# Patient Record
Sex: Male | Born: 1975 | Race: White | Hispanic: No | Smoking: Never smoker
Health system: Southern US, Community
[De-identification: ages and names within clinical notes are randomized; demographics above are authoritative.]

## PROBLEM LIST (undated history)

## (undated) ENCOUNTER — Emergency Department (HOSPITAL_COMMUNITY): Admission: EM | Payer: Self-pay | Source: Home / Self Care

## (undated) DIAGNOSIS — I10 Essential (primary) hypertension: Secondary | ICD-10-CM

## (undated) DIAGNOSIS — F32A Depression, unspecified: Secondary | ICD-10-CM

## (undated) DIAGNOSIS — F329 Major depressive disorder, single episode, unspecified: Secondary | ICD-10-CM

---
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
## 2005-09-27 ENCOUNTER — Ambulatory Visit: Payer: Self-pay | Admitting: Urology

## 2005-10-04 ENCOUNTER — Ambulatory Visit: Payer: Self-pay | Admitting: Urology

## 2005-10-04 ENCOUNTER — Emergency Department: Payer: Self-pay | Admitting: Emergency Medicine

## 2005-10-24 ENCOUNTER — Ambulatory Visit: Payer: Self-pay | Admitting: Urology

## 2007-05-25 IMAGING — CR DG ABDOMEN 1V
1 series · 2 of 2 positions shown · non-contrast
Comparison: none

REASON FOR EXAM: renal calculi-lithotripsy
COMMENTS:

[Series 1: view not recorded · 0.17mm/px · 2 of 2 slices shown]
[im 1/2]
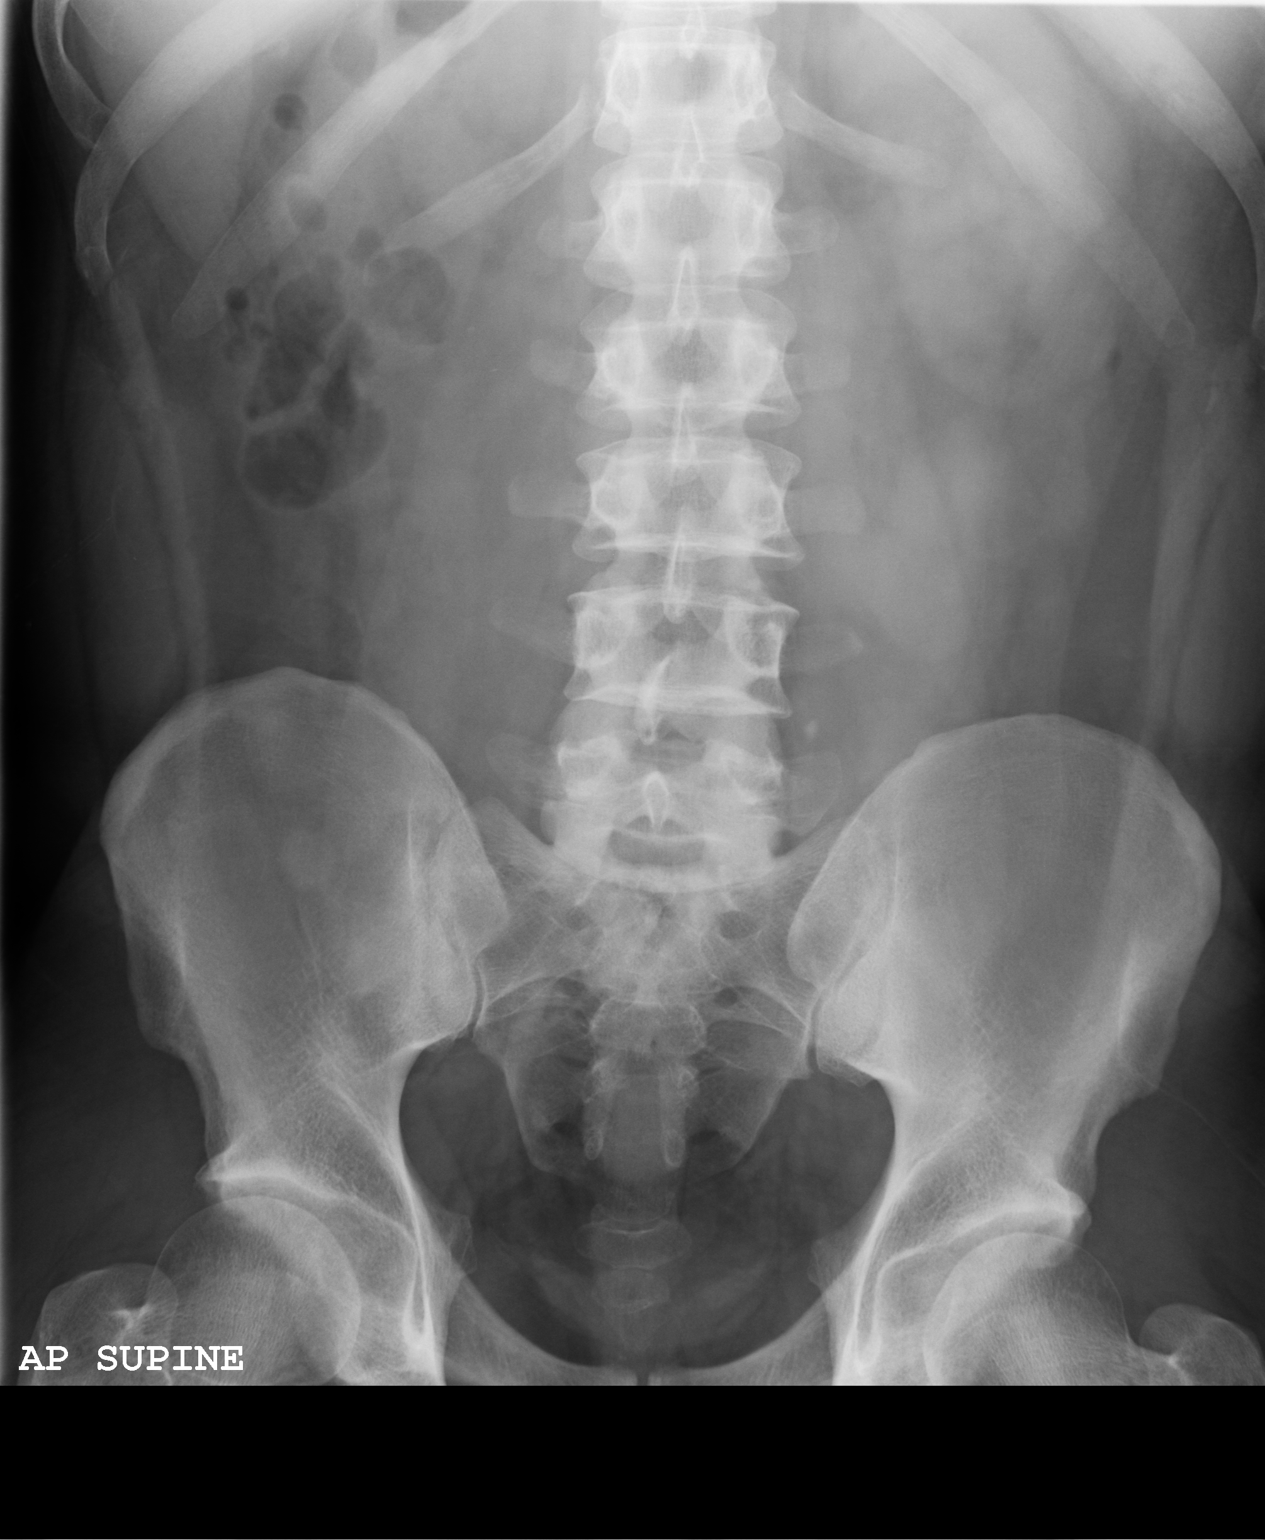
[im 2/2]
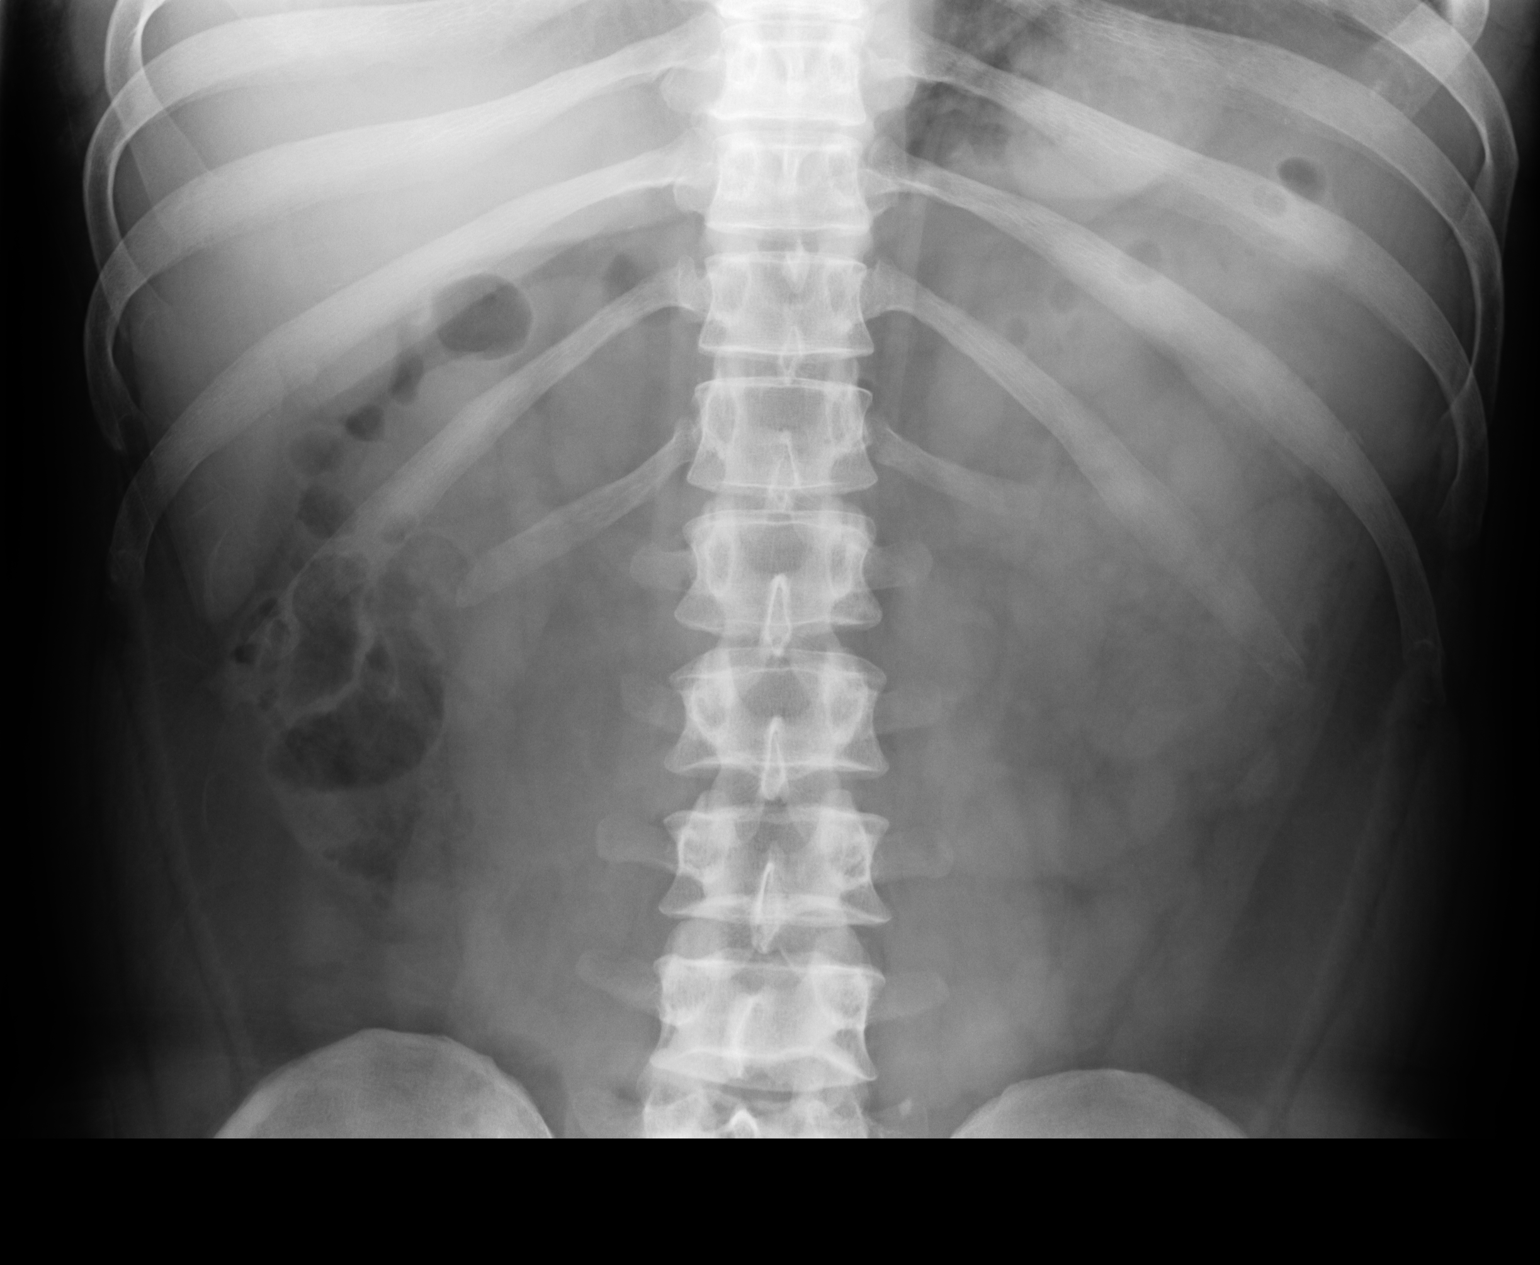

[2 of 2 positions shown; findings below may reference images not displayed]

PROCEDURE:     DXR - DXR KIDNEY URETER BLADDER  - October 04, 2005  [DATE]

RESULT:     AP view of the abdomen shows a 5 mm calcification inferior to
the L4 transverse process on the LEFT.  This calcification is consistent
with a LEFT ureteral stone that has moved inferiorly since the prior exam of
09-27-05 at which time the stone was located over the third LEFT transverse
process. No additional renal or ureteral calcifications are seen. The bowel
gas pattern is normal.  The osseous structures show no significant
abnormalities.
IMPRESSION: 1)Please see above.

## 2010-10-16 DIAGNOSIS — I10 Essential (primary) hypertension: Secondary | ICD-10-CM | POA: Insufficient documentation

## 2010-10-16 DIAGNOSIS — E669 Obesity, unspecified: Secondary | ICD-10-CM | POA: Insufficient documentation

## 2014-12-28 ENCOUNTER — Encounter: Payer: Self-pay | Admitting: Sports Medicine

## 2014-12-28 ENCOUNTER — Ambulatory Visit (INDEPENDENT_AMBULATORY_CARE_PROVIDER_SITE_OTHER): Payer: BLUE CROSS/BLUE SHIELD | Admitting: Sports Medicine

## 2014-12-28 ENCOUNTER — Ambulatory Visit (INDEPENDENT_AMBULATORY_CARE_PROVIDER_SITE_OTHER): Payer: BLUE CROSS/BLUE SHIELD

## 2014-12-28 DIAGNOSIS — M2142 Flat foot [pes planus] (acquired), left foot: Secondary | ICD-10-CM

## 2014-12-28 DIAGNOSIS — M79673 Pain in unspecified foot: Secondary | ICD-10-CM

## 2014-12-28 DIAGNOSIS — M7662 Achilles tendinitis, left leg: Secondary | ICD-10-CM

## 2014-12-28 DIAGNOSIS — M722 Plantar fascial fibromatosis: Secondary | ICD-10-CM | POA: Diagnosis not present

## 2014-12-28 DIAGNOSIS — M2141 Flat foot [pes planus] (acquired), right foot: Secondary | ICD-10-CM | POA: Diagnosis not present

## 2014-12-28 DIAGNOSIS — M205X9 Other deformities of toe(s) (acquired), unspecified foot: Secondary | ICD-10-CM

## 2014-12-28 DIAGNOSIS — M21549 Acquired clubfoot, unspecified foot: Secondary | ICD-10-CM | POA: Diagnosis not present

## 2014-12-28 DIAGNOSIS — M7661 Achilles tendinitis, right leg: Secondary | ICD-10-CM | POA: Diagnosis not present

## 2014-12-28 MED ORDER — TRIAMCINOLONE ACETONIDE 10 MG/ML IJ SUSP: 10.0000 mg | Freq: Once | INTRAMUSCULAR | Status: AC

## 2014-12-28 MED ORDER — MELOXICAM 15 MG PO TABS: 15.0000 mg | ORAL_TABLET | Freq: Every day | ORAL | Status: DC

## 2014-12-28 NOTE — Progress Notes (Deleted)
   Subjective:    Patient ID: Evan NearingJeremy W Antonacci, male    DOB: 1975-09-28, 39 y.o.   MRN: [id number]  HPI    Review of Systems  All other systems reviewed and are negative.      Objective:   Physical Exam        Assessment & Plan:

## 2014-12-28 NOTE — Progress Notes (Signed)
Patient ID: Evan Watson, male   DOB: 08/02/1975, 39 y.o.   MRN: [id number] Subjective: Evan Watson is a 39 y.o. male patient who presents to office for evaluation of bilateral foot pain. Patient complains of progressive pain especially over the last few months at the bottoms of both feet. Reports feet are most tender, especially at the end of the day. Report feels sharp stiffness at the big toe joint feels fatigued and tenderness at the nodules in the arches and also tightness along the heels/calves. Patient has tried Aleve with some relief and has tried changing his work shoes. Patient denies any other pedal complaints. Denies injury/trip/fall/sprain/any causative factors.   Review of Systems  All other systems reviewed and are negative.  There are no active problems to display for this patient.  No current outpatient prescriptions on file prior to visit.   No current facility-administered medications on file prior to visit.   Allergies  Allergen Reactions  . Bupropion Anxiety    Hypertension      Objective:  General: Alert and oriented x3 in no acute distress  Dermatology: No open lesions bilateral lower extremities, there are subcutaneous, well-defined, round fibrous nodules <0.5cm present at the medial plantar fascial bands bilateral, non-pulsatile,  no webspace macerations, no ecchymosis bilateral, all nails x 10 are well manicured.  Vascular: Dorsalis Pedis and Posterior Tibial pedal pulses 2/4, Capillary Fill Time 3 seconds,(+) pedal hair growth bilateral, no edema bilateral lower extremities, Temperature gradient within normal limits.  Neurology: Michaell Cowing sensation intact via light touch bilateral, Protective sensation intact  with Semmes Weinstein Monofilament to all pedal sites, Position sense intact, vibratory intact bilateral, Deep tendon reflexes within normal limits bilateral, No babinski sign present bilateral. - )Tinels sign bilateral.   Musculoskeletal: Most  tenderness with palpation at fibrous nodules bilateral, mild tenderness along medial fascial band and achilles tendon biltaeral, no nodularity, no dell, No pain with calf compression bilateral. There is decreased ankle rom with knee extending  vs flexed resembling gastroc equnius bilateral, Subtalar joint range of motion is within normal limits, there is no 1st ray hypermobility noted bilateral, decreased 1st MPJ rom Left>right with functional limitus noted on weightbearing exam.Strength within normal limits in all groups bilateral.   Gait: Mildy Antalgic gait with increased medial arch collapse and pronatory influence noted on Left>right foot with medial 1st MPJ roll off in toe-off.   Xrays  Right and Left Foot 3 views    Impression: Normal osseous mineralization, mild joint space narrowing with osteophyte formation at bilateral first metatarsophalangeal joints, mild enthesopathy noted peroneal tendon insertion, fifth metatarsal base and at the insertion of the Achilles and plantar fascia, bilateral heels, left greater than right, pes planus foot type, no fractures, no dislocations, soft tissues within normal limits. No foreign body.        Assessment and Plan: Problem List Items Addressed This Visit    None    Visit Diagnoses    Plantar fascial fibromatosis    -  Primary    Relevant Medications    triamcinolone acetonide (KENALOG) 10 MG/ML injection 10 mg    Foot pain, unspecified laterality        Relevant Medications    meloxicam (MOBIC) 15 MG tablet    triamcinolone acetonide (KENALOG) 10 MG/ML injection 10 mg    Other Relevant Orders    DG Foot Complete Left    DG Foot Complete Right    Plantar fasciitis, bilateral  Relevant Medications    meloxicam (MOBIC) 15 MG tablet    Achilles tendonitis, bilateral        Relevant Medications    meloxicam (MOBIC) 15 MG tablet    Acquired equinovarus deformity, unspecified laterality        Hallux limitus, unspecified laterality         Pes planus of both feet            -Complete examination performed -Xrays reviewed -Discussed treatement options for fibromas, fascitiis, tendinitis, hallux limitus and relations to foot type -At point of max tenderness at plantar fibromas to a 2 mL mixture of lidocaine, Marcaine, Kenalog and dexamethasone phosphate bilateral without complication -Recommend an dispensed daily stretching exercises -Recommend and dispensed plantar fascial brace to use daily -Recommend icing once to twice daily; dispensed ice pack -Recommended good supportive shoes daily and continuing with over-the-counter insert. If these do not improve his symptoms, we will consider custom functional foot orthotics -Arrival meloxicam 15 mg daily to supplement use of Aleve -Patient to return to office 3 weeks or sooner if condition worsens.  Asencion Islamitorya Ric Rosenberg, DPM

## 2014-12-28 NOTE — Patient Instructions (Signed)

## 2015-01-21 ENCOUNTER — Ambulatory Visit (INDEPENDENT_AMBULATORY_CARE_PROVIDER_SITE_OTHER): Payer: BLUE CROSS/BLUE SHIELD | Admitting: Sports Medicine

## 2015-01-21 ENCOUNTER — Encounter: Payer: Self-pay | Admitting: Sports Medicine

## 2015-01-21 DIAGNOSIS — M2141 Flat foot [pes planus] (acquired), right foot: Secondary | ICD-10-CM

## 2015-01-21 DIAGNOSIS — M2142 Flat foot [pes planus] (acquired), left foot: Secondary | ICD-10-CM

## 2015-01-21 DIAGNOSIS — M79673 Pain in unspecified foot: Secondary | ICD-10-CM | POA: Diagnosis not present

## 2015-01-21 DIAGNOSIS — M722 Plantar fascial fibromatosis: Secondary | ICD-10-CM

## 2015-01-21 DIAGNOSIS — M7662 Achilles tendinitis, left leg: Secondary | ICD-10-CM | POA: Diagnosis not present

## 2015-01-21 DIAGNOSIS — M7661 Achilles tendinitis, right leg: Secondary | ICD-10-CM

## 2015-01-21 DIAGNOSIS — M21549 Acquired clubfoot, unspecified foot: Secondary | ICD-10-CM

## 2015-01-21 DIAGNOSIS — M205X9 Other deformities of toe(s) (acquired), unspecified foot: Secondary | ICD-10-CM | POA: Diagnosis not present

## 2015-01-21 NOTE — Progress Notes (Signed)
Patient ID: Evan NearingJeremy W Hitch, male   DOB: 1975-03-21, 39 y.o.   MRN: [id number]  Subjective: Evan Watson is a 39 y.o. male patient who presents to office for evaluation of bilateral foot pain. Patient complains of progressive pain especially over the last few months at the bottoms of both feet. Reports feet are most tender, especially at the end of the day. Report feels sharp stiffness at the big toe joint feels fatigued and tenderness at the nodules in the arches and also tightness along the heels/calves. Patient has tried Aleve with some relief and has tried changing his work shoes. Patient denies any other pedal complaints. Denies injury/trip/fall/sprain/any causative factors.   Review of Systems  All other systems reviewed and are negative.  There are no active problems to display for this patient.  Current Outpatient Prescriptions on File Prior to Visit  Medication Sig Dispense Refill  . citalopram (CELEXA) 20 MG tablet   1  . meloxicam (MOBIC) 15 MG tablet Take 1 tablet (15 mg total) by mouth daily. 30 tablet 0   Current Facility-Administered Medications on File Prior to Visit  Medication Dose Route Frequency Provider Last Rate Last Dose  . triamcinolone acetonide (KENALOG) 10 MG/ML injection 10 mg  10 mg Other Once Asencion Islamitorya Manolo Bosket, DPM       Allergies  Allergen Reactions  . Bupropion Anxiety    Hypertension      Objective:  General: Alert and oriented x3 in no acute distress  Dermatology: No open lesions bilateral lower extremities, there are subcutaneous, well-defined, round fibrous nodules that appear smaller than last encounter present at the medial plantar fascial bands bilateral, non-pulsatile,  no webspace macerations, no ecchymosis bilateral, all nails x 10 are well manicured.  Vascular: Dorsalis Pedis and Posterior Tibial pedal pulses 2/4, Capillary Fill Time 3 seconds,(+) pedal hair growth bilateral, no edema bilateral lower extremities, Temperature gradient within  normal limits.  Neurology: Michaell CowingGross sensation intact via light touch bilateral, Protective sensation intact  with Semmes Weinstein Monofilament to all pedal sites, Position sense intact, vibratory intact bilateral, Deep tendon reflexes within normal limits bilateral, No babinski sign present bilateral. (-) Tinels sign bilateral.   Musculoskeletal: No tenderness with palpation at fibrous nodules bilateral, Decreased tenderness along medial fascial band and achilles tendon biltaeral, no nodularity, no dell, No pain with calf compression bilateral. There is decreased ankle rom with knee extending  vs flexed resembling gastroc equnius bilateral, Subtalar joint range of motion is within normal limits, there is no 1st ray hypermobility noted bilateral, decreased 1st MPJ rom Left>right with functional limitus noted on weightbearing exam.Strength within normal limits in all groups bilateral.    Assessment and Plan: Problem List Items Addressed This Visit    None    Visit Diagnoses    Plantar fascial fibromatosis    -  Primary    Plantar fasciitis, bilateral        Achilles tendonitis, bilateral        Acquired equinovarus deformity, unspecified laterality        Hallux limitus, unspecified laterality        Pes planus of both feet        Foot pain, unspecified laterality           -Complete examination performed -Discussed treatement options for fibromas, fascitiis, tendinitis, hallux limitus and relations to foot type -Recommend cont with daily stretching exercises -Recommend cont with plantar fascial brace to use daily and alternation of night splint of which he owns -Recommend  cont icing once to twice daily -Scanned patient today for custom functional foot orthotics with deep heel accomodation; Rx sent to [facility] lab  -Patient to return to office pick up orthotics or sooner if condition worsens.  Asencion Islam, DPM

## 2015-02-10 ENCOUNTER — Telehealth: Payer: Self-pay | Admitting: *Deleted

## 2015-02-10 DIAGNOSIS — M7662 Achilles tendinitis, left leg: Secondary | ICD-10-CM

## 2015-02-10 DIAGNOSIS — M722 Plantar fascial fibromatosis: Secondary | ICD-10-CM

## 2015-02-10 DIAGNOSIS — M7661 Achilles tendinitis, right leg: Secondary | ICD-10-CM

## 2015-02-10 DIAGNOSIS — M79673 Pain in unspecified foot: Secondary | ICD-10-CM

## 2015-02-10 MED ORDER — MELOXICAM 15 MG PO TABS: 15.0000 mg | ORAL_TABLET | Freq: Every day | ORAL | Status: DC

## 2015-02-10 NOTE — Telephone Encounter (Addendum)
Pt request refill of Mobic and status of his orthotics.  Left message informing that the Meloxicam had been called to the previously ordered pharmacy and that Dr. Wynema Birch assistant would be calling with the status of the orthotic, but it often took right at 4-6 weeks before the orthotics came to the office.

## 2015-02-23 ENCOUNTER — Ambulatory Visit (INDEPENDENT_AMBULATORY_CARE_PROVIDER_SITE_OTHER): Payer: BLUE CROSS/BLUE SHIELD | Admitting: Podiatry

## 2015-02-23 DIAGNOSIS — M722 Plantar fascial fibromatosis: Secondary | ICD-10-CM

## 2015-02-23 DIAGNOSIS — M7662 Achilles tendinitis, left leg: Secondary | ICD-10-CM

## 2015-02-23 DIAGNOSIS — M7661 Achilles tendinitis, right leg: Secondary | ICD-10-CM

## 2015-04-05 ENCOUNTER — Ambulatory Visit: Payer: BLUE CROSS/BLUE SHIELD | Admitting: Sports Medicine

## 2018-09-25 ENCOUNTER — Encounter: Payer: Self-pay | Admitting: Emergency Medicine

## 2018-09-25 ENCOUNTER — Emergency Department
Admission: EM | Admit: 2018-09-25 | Discharge: 2018-09-25 | Disposition: A | Discharge status: Home / Self Care | Payer: Managed Care, Other (non HMO) | Attending: Student | Admitting: Student

## 2018-09-25 DIAGNOSIS — R55 Syncope and collapse: Secondary | ICD-10-CM | POA: Insufficient documentation

## 2018-09-25 DIAGNOSIS — E860 Dehydration: Secondary | ICD-10-CM | POA: Insufficient documentation

## 2018-09-25 DIAGNOSIS — R4020 Unspecified coma: Secondary | ICD-10-CM

## 2018-09-25 DIAGNOSIS — I10 Essential (primary) hypertension: Secondary | ICD-10-CM | POA: Insufficient documentation

## 2018-09-25 HISTORY — DX: Depression, unspecified: F32.A

## 2018-09-25 HISTORY — DX: Essential (primary) hypertension: I10

## 2018-09-25 LAB — TROPONIN I (HIGH SENSITIVITY): Troponin I (High Sensitivity): 3 ng/L (ref <18)

## 2018-09-25 LAB — BASIC METABOLIC PANEL
Anion gap: 8 (ref 5–15)
BUN: 22 mg/dL — ABNORMAL HIGH (ref 6–20)
CO2: 24 mmol/L (ref 22–32)
Calcium: 9.1 mg/dL (ref 8.9–10.3)
Chloride: 105 mmol/L (ref 98–111)
Creatinine, Ser: 1.29 mg/dL — ABNORMAL HIGH (ref 0.61–1.24)
GFR calc Af Amer: >60 mL/min (ref >60)
GFR calc non Af Amer: >60 mL/min (ref >60)
Glucose, Bld: 152 mg/dL — ABNORMAL HIGH (ref 70–99)
Potassium: 3.7 mmol/L (ref 3.5–5.1)
Sodium: 137 mmol/L (ref 135–145)

## 2018-09-25 LAB — URINALYSIS, COMPLETE (UACMP) WITH MICROSCOPIC
Bacteria, UA: NONE SEEN
Bilirubin Urine: NEGATIVE
Glucose, UA: NEGATIVE mg/dL
Hgb urine dipstick: NEGATIVE
Ketones, ur: NEGATIVE mg/dL
Leukocytes,Ua: NEGATIVE
Nitrite: NEGATIVE
Protein, ur: 30 mg/dL — AB
Specific Gravity, Urine: 1.017 (ref 1.005–1.030)
Squamous Epithelial / HPF: NONE SEEN (ref 0–5)
pH: 7 (ref 5.0–8.0)

## 2018-09-25 LAB — CBC
HCT: 46.3 % (ref 39.0–52.0)
Hemoglobin: 16.4 g/dL (ref 13.0–17.0)
MCH: 30.3 pg (ref 26.0–34.0)
MCHC: 35.4 g/dL (ref 30.0–36.0)
MCV: 85.4 fL (ref 80.0–100.0)
Platelets: 238 10*3/uL (ref 150–400)
RBC: 5.42 MIL/uL (ref 4.22–5.81)
RDW: 12.9 % (ref 11.5–15.5)
WBC: 9.8 10*3/uL (ref 4.0–10.5)
nRBC: 0 % (ref 0.0–0.2)

## 2018-09-25 MED ORDER — SODIUM CHLORIDE 0.9 % IV BOLUS
1000.0000 mL | Freq: Once | INTRAVENOUS | Status: AC
  Administered 2018-09-25: 1000 mL via INTRAVENOUS

## 2018-09-25 NOTE — ED Triage Notes (Signed)
Pt in via [facility] from place of employment, reports syncope episode x 2 today; first occurrence post donating plasma, provided liter of IV fluids, gatorade and a snack prior to departure.  Pt reports arriving to work shortly after, was attempting to eat some lunch, began feeling weak again and upon standing, second syncopal episode occurred.  Pt denies falling, denies hitting head, had another employee with him at time of episode.  Pt A/Ox4 upon arrival, received 262mL NS prior to arrival via EMS.  Pt appears pale, BP soft, other vitals WDL.  NAD noted at this time.

## 2018-09-25 NOTE — Discharge Instructions (Addendum)
Thank you for letting us take care of you in the emergency department today.   Please continue to take your regular, prescribed medications. Please stay well hydrated and be sure you drink plenty of water and eat before your next donation. Thank you for donating!  Please follow up with: - Your primary care doctor to review your ER visit and follow up on your symptoms.   Please return to the ER for any new or worsening symptoms.

## 2018-09-25 NOTE — ED Notes (Signed)
EDP to bedside. 

## 2018-09-25 NOTE — ED Provider Notes (Signed)
[facility] Emergency Department Provider Note  ____________________________________________   First MD Initiated Contact with Patient 09/25/18 1523     (approximate)  I have reviewed the triage vital signs and the nursing notes.  History  Chief Complaint Loss of Consciousness    HPI Evan Watson is a 43 y.o. male with a history of hypertension, depression who presents the emergency department for loss of consciousness.  Patient was donating plasma today, and had 1 brief episode immediately after his donation. He was give IVF, took PO fluids, and then felt like he was improved so he headed in to work.  At work he began to feel weak and lightheaded again, and had a second episode.  No associated falls or trauma, no head injury.  No shaking or seizure-like activity described.  He was immediately alert and oriented after the events.  Patient states before donating blood, he did not eat or drink a significant amount. No hx of arrhythmias.          Past Medical Hx Past Medical History:  Diagnosis Date  . Depression   . Hypertension     Problem List There are no active problems to display for this patient.   Past Surgical Hx History reviewed. No pertinent surgical history.  Medications Prior to Admission medications   Medication Sig Start Date End Date Taking? Authorizing Provider  citalopram (CELEXA) 20 MG tablet  10/28/14   [provider]  meloxicam (MOBIC) 15 MG tablet Take 1 tablet (15 mg total) by mouth daily. 02/10/15   Asencion IslamStover, Titorya, DPM    Allergies Bupropion  Family Hx No family history on file.  Social Hx Social History   Tobacco Use  . Smoking status: Never Smoker  . Smokeless tobacco: Never Used  Substance Use Topics  . Alcohol use: Yes  . Drug use: Never     Review of Systems  Constitutional: Negative for fever. Negative for chills. Eyes: Negative for visual changes. ENT: Negative for sore throat.  Cardiovascular: Negative for chest pain. Respiratory: Negative for shortness of breath. Gastrointestinal: Negative for abdominal pain. Negative for nausea. Negative for vomiting. Genitourinary: Negative for dysuria. Musculoskeletal: Negative for leg swelling. Skin: Negative for rash. Neurological: + loss of consciousness   Physical Exam  Vital Signs: ED Triage Vitals  Enc Vitals Group     BP 09/25/18 1517 100/66     Pulse Rate 09/25/18 1517 67     Resp 09/25/18 1517 19     Temp 09/25/18 1517 97.9 F (36.6 C)     Temp Source 09/25/18 1517 Oral     SpO2 09/25/18 1517 95 %     Weight 09/25/18 1518 252 lb (114.3 kg)     Height 09/25/18 1518 5\' 7"  (1.702 m)     Head Circumference --      Peak Flow --      Pain Score 09/25/18 1518 0     Pain Loc --      Pain Edu? --      Excl. in GC? --     Constitutional: Alert and oriented.  Eyes: Conjunctivae clear. Sclera anicteric. Head: Normocephalic. Atraumatic. Nose: No congestion. No rhinorrhea. Mouth/Throat: Mucous membranes are moist.  Neck: No stridor.   Cardiovascular: Normal rate, regular rhythm. No murmurs. Extremities well perfused. Respiratory: Normal respiratory effort.  Lungs CTAB. Gastrointestinal: Soft and non-tender. No distention.  Musculoskeletal: No lower extremity edema. Neurologic:  Normal speech and language. No gross focal neurologic deficits are appreciated.  Skin: Skin is warm, dry and intact. No rash noted. Psychiatric: Mood and affect are appropriate for situation.  EKG  Personally reviewed.   Rate: 80 Rhythm: sinus Axis: normal Intervals: WNL No acute ischemic changes No evidence of Brugada, Wolff-Parkinson-White, or prolonged QTC No STEMI    Radiology  N/A   Procedures  Procedure(s) performed (including critical care):  Procedures   Initial Impression / Assessment and Plan / ED Course  43 y.o. male who presents to the ED for loss of consciousness after donating blood today.  Ddx:  vasovagal, orthostatic, dehydration, likely combination of multiple.  Presentation not consistent with seizure activity.  Not consistent with acute intracranial abnormality, no associated head trauma, do not feel emergent head imaging is indicated.  Plan: labs, fluids, EKG, reassess  EKG without ischemic changes or evidence of arrhythmia.  High-sensitivity troponin negative.  No evidence of urine infection.  BMP consistent with mild dehydration, creatinine 1.29.  Patient feeling improved after fluids, tolerated PO.  Improvement in blood pressure 116/55.  As such, will plan for discharge.  Patient voices understanding and is comfortable with the plan.  Advised PCP follow-up, given return precautions.   Final Clinical Impression(s) / ED Diagnosis  Final diagnoses:  Loss of consciousness ([place])  Dehydration       Note:  This document was prepared using Dragon voice recognition software and may include unintentional dictation errors.   Lilia Pro., MD 09/25/18 [phone]

## 2018-09-25 NOTE — ED Notes (Signed)
Patient was able to consume crackers and ginger ale without nausea.

## 2018-12-17 ENCOUNTER — Other Ambulatory Visit: Payer: Self-pay

## 2018-12-17 ENCOUNTER — Telehealth: Payer: Self-pay | Admitting: *Deleted

## 2018-12-17 DIAGNOSIS — Z20822 Contact with and (suspected) exposure to covid-19: Secondary | ICD-10-CM

## 2018-12-17 NOTE — Telephone Encounter (Signed)
This encounter was created in error - please disregard.

## 2018-12-17 NOTE — Telephone Encounter (Signed)
Patient is calling to request verification of COVID testing for his PCP. Patient needs note for work regarding his testing for COVID. His PCP will write the note if he gets a copy of the order faxed to him.  PCP: Valera Castle, MD Fax: [phone]  Told patient I would request copy of order faxed to PCP.

## 2018-12-17 NOTE — Telephone Encounter (Signed)
Call to PCP office- they can see chart in [place]- but can not access pending test. Verified patient tested for COVID : 11/25 9:45 [place], [place]. Patient is awaiting results. Have also requested copy of order for PCP.

## 2018-12-17 NOTE — Telephone Encounter (Signed)
Per pt. Request, faxed copy of [facility] lab order to PCP office, Dr. Kym Groom @ [phone].  Call placed to pt. Advised that the information was faxed to his PCP office as requested.  Verb. Understanding.

## 2018-12-19 LAB — NOVEL CORONAVIRUS, NAA: SARS-CoV-2, NAA: NOT DETECTED

## 2018-12-22 ENCOUNTER — Telehealth: Payer: Self-pay | Admitting: Family Medicine

## 2018-12-22 NOTE — Telephone Encounter (Signed)
Patient is calling receive his negative COVID test results. Patient expressed understanding. 

## 2019-07-22 ENCOUNTER — Other Ambulatory Visit: Payer: Self-pay

## 2019-07-22 ENCOUNTER — Encounter: Payer: Self-pay | Admitting: Urology

## 2019-07-22 ENCOUNTER — Ambulatory Visit: Payer: Managed Care, Other (non HMO) | Admitting: Urology

## 2019-07-22 VITALS — BP 131/84 | HR 68 | Ht 67.0 in | Wt 245.1 lb

## 2019-07-22 DIAGNOSIS — R3129 Other microscopic hematuria: Secondary | ICD-10-CM

## 2019-07-22 NOTE — Progress Notes (Signed)
   06/23/19 10:00 AM   Evan Watson 09-03-1975 [id number]  Referring provider: Dione Housekeeper, MD [address] [place],  [place] [address] Chief Complaint  Patient presents with  . Hematuria    HPI: Evan Watson is a 44 y.o. male who presents for gross hematuria.  -Seen by his PCP on 07/08/2019 complaining of LUTS including frequency, urgency, mild dysuria, weak stream, and pelvic discomfort -Urinalysis showed no significant micro hematuria -Urine culture grew mixed flora -Denies gross hematuria -prior history of stone disease treated with lithotripsy in 2007 but he did not follow up after treatment -No previous tobacco use -Was given Rx tamsulosin by his PCP but was told to hold off taking for 2 weeks and he is going to start today  PMH: Past Medical History:  Diagnosis Date  . Depression   . Hypertension     Surgical History: No past surgical history on file.  Home Medications:  Allergies as of 07/22/2019   No Active Allergies     Medication List       Accurate as of July 22, 2019 10:00 AM. If you have any questions, ask your nurse or doctor.        albuterol 108 (90 Base) MCG/ACT inhaler Commonly known as: VENTOLIN HFA Inhale 1-2 puffs into the lungs every 6 (six) hours as needed for wheezing or shortness of breath.   amLODipine 10 MG tablet Commonly known as: NORVASC Take 10 mg by mouth daily.   buPROPion 150 MG 24 hr tablet Commonly known as: WELLBUTRIN XL Take 150 mg by mouth daily.   citalopram 40 MG tablet Commonly known as: CELEXA Take 40 mg by mouth daily.   hydrochlorothiazide 25 MG tablet Commonly known as: HYDRODIURIL Take 25 mg by mouth daily.   losartan 100 MG tablet Commonly known as: COZAAR Take 100 mg by mouth daily.       Allergies: No Active Allergies  Family History: No family history on file.  Social History:  reports that he has never smoked. He has never used smokeless tobacco. He reports current alcohol  use. He reports that he does not use drugs.   Physical Exam: There were no vitals taken for this visit.  Constitutional:  Alert and oriented, No acute distress. HEENT: [place] AT, moist mucus membranes.  Trachea midline, no masses. Cardiovascular: No clubbing, cyanosis, or edema. Respiratory: Normal respiratory effort, no increased work of breathing. Skin: No rashes, bruises or suspicious lesions. Neurologic: Grossly intact, no focal deficits, moving all 4 extremities. Psychiatric: Normal mood and affect.  Laboratory Data:  Lab Results  Component Value Date   CREATININE 1.29 (H) 09/25/2018    Urinalysis Dipstick 1+ blood/microscopy 3-10 RBC   Assessment & Plan:    1. Microscopic Hematuria -UA risk stratification: Intermediate -We discussed the standard recommended evaluation of intermediate risk hematuria including renal ultrasound and cystoscopy. -He agreed to the renal ultrasound but wants to think about cystoscopy and he would like to start his tamsulosin before scheduling   2. Lower urinary tract symptoms -To start tamsulosin today, most likely prostatic in origin  [facility] [address] [place], [place] [address] [phone]  I, Francina Ames Peace, am acting as a Neurosurgeon for Dr. Lorin Picket C. Olla Delancey.  I have reviewed the above documentation for accuracy and completeness, and I agree with the above.    Riki Altes, MD

## 2019-07-23 LAB — URINALYSIS, COMPLETE
Bilirubin, UA: NEGATIVE
Glucose, UA: NEGATIVE
Ketones, UA: NEGATIVE
Leukocytes,UA: NEGATIVE
Nitrite, UA: NEGATIVE
Protein,UA: NEGATIVE
Specific Gravity, UA: 1.015 (ref 1.005–1.030)
Urobilinogen, Ur: 0.2 mg/dL (ref 0.2–1.0)
pH, UA: 7.5 (ref 5.0–7.5)

## 2019-07-23 LAB — MICROSCOPIC EXAMINATION: Bacteria, UA: NONE SEEN

## 2019-07-24 ENCOUNTER — Encounter: Payer: Self-pay | Admitting: Urology

## 2021-01-31 ENCOUNTER — Other Ambulatory Visit: Payer: Self-pay

## 2021-01-31 ENCOUNTER — Emergency Department
Admission: EM | Admit: 2021-01-31 | Discharge: 2021-01-31 | Disposition: A | Discharge status: Home / Self Care | Payer: 59 | Attending: Emergency Medicine | Admitting: Emergency Medicine

## 2021-01-31 DIAGNOSIS — U071 COVID-19: Secondary | ICD-10-CM

## 2021-01-31 DIAGNOSIS — R059 Cough, unspecified: Secondary | ICD-10-CM | POA: Diagnosis present

## 2021-01-31 DIAGNOSIS — I10 Essential (primary) hypertension: Secondary | ICD-10-CM | POA: Insufficient documentation

## 2021-01-31 DIAGNOSIS — E860 Dehydration: Secondary | ICD-10-CM | POA: Insufficient documentation

## 2021-01-31 DIAGNOSIS — R197 Diarrhea, unspecified: Secondary | ICD-10-CM

## 2021-01-31 LAB — LIPASE, BLOOD: Lipase: 26 U/L (ref 11–51)

## 2021-01-31 LAB — URINALYSIS, ROUTINE W REFLEX MICROSCOPIC
Bilirubin Urine: NEGATIVE
Glucose, UA: NEGATIVE mg/dL
Ketones, ur: NEGATIVE mg/dL
Leukocytes,Ua: NEGATIVE
Nitrite: NEGATIVE
Protein, ur: 30 mg/dL — AB
Specific Gravity, Urine: 1.027 (ref 1.005–1.030)
pH: 5 (ref 5.0–8.0)

## 2021-01-31 LAB — GASTROINTESTINAL PANEL BY PCR, STOOL (REPLACES STOOL CULTURE)

## 2021-01-31 LAB — COMPREHENSIVE METABOLIC PANEL
ALT: 28 U/L (ref 0–44)
AST: 19 U/L (ref 15–41)
Albumin: 4.3 g/dL (ref 3.5–5.0)
Alkaline Phosphatase: 78 U/L (ref 38–126)
Anion gap: 9 (ref 5–15)
BUN: 21 mg/dL — ABNORMAL HIGH (ref 6–20)
CO2: 26 mmol/L (ref 22–32)
Calcium: 10.1 mg/dL (ref 8.9–10.3)
Chloride: 103 mmol/L (ref 98–111)
Creatinine, Ser: 1.09 mg/dL (ref 0.61–1.24)
GFR, Estimated: >60 mL/min (ref >60)
Glucose, Bld: 101 mg/dL — ABNORMAL HIGH (ref 70–99)
Potassium: 3.4 mmol/L — ABNORMAL LOW (ref 3.5–5.1)
Sodium: 138 mmol/L (ref 135–145)
Total Bilirubin: 1 mg/dL (ref 0.3–1.2)
Total Protein: 7.8 g/dL (ref 6.5–8.1)

## 2021-01-31 LAB — CBC
HCT: 47.2 % (ref 39.0–52.0)
Hemoglobin: 16.6 g/dL (ref 13.0–17.0)
MCH: 29.6 pg (ref 26.0–34.0)
MCHC: 35.2 g/dL (ref 30.0–36.0)
MCV: 84.1 fL (ref 80.0–100.0)
Platelets: 310 10*3/uL (ref 150–400)
RBC: 5.61 MIL/uL (ref 4.22–5.81)
RDW: 12.9 % (ref 11.5–15.5)
WBC: 13.8 10*3/uL — ABNORMAL HIGH (ref 4.0–10.5)
nRBC: 0 % (ref 0.0–0.2)

## 2021-01-31 LAB — MAGNESIUM: Magnesium: 1.8 mg/dL (ref 1.7–2.4)

## 2021-01-31 LAB — C DIFFICILE QUICK SCREEN W PCR REFLEX
C Diff antigen: NEGATIVE
C Diff interpretation: NOT DETECTED
C Diff toxin: NEGATIVE

## 2021-01-31 MED ORDER — ONDANSETRON 4 MG PO TBDP: 4.0000 mg | ORAL_TABLET | Freq: Three times a day (TID) | ORAL | 0 refills | Status: DC | PRN

## 2021-01-31 MED ORDER — LACTATED RINGERS IV BOLUS
1000.0000 mL | Freq: Once | INTRAVENOUS | Status: AC
  Administered 2021-01-31: 1000 mL via INTRAVENOUS

## 2021-01-31 MED ORDER — LOPERAMIDE HCL 2 MG PO CAPS
2.0000 mg | ORAL_CAPSULE | Freq: Once | ORAL | Status: AC
Start: 2021-01-31 — End: 2021-01-31
  Administered 2021-01-31: 2 mg via ORAL
  Filled 2021-01-31: qty 1

## 2021-01-31 MED ORDER — ONDANSETRON HCL 4 MG/2ML IJ SOLN
4.0000 mg | Freq: Once | INTRAMUSCULAR | Status: AC
  Administered 2021-01-31: 4 mg via INTRAVENOUS
  Filled 2021-01-31: qty 2

## 2021-01-31 NOTE — ED Triage Notes (Signed)
Pt states he was dx with covid jan 1st, states he thought he was getting better and then started having diarrhea q12min to hour since Sunday it has progressed and is having dizziness in the past 24hrs,

## 2021-01-31 NOTE — ED Provider Notes (Signed)
[facility] Provider Note    Event Date/Time   First MD Initiated Contact with Patient 01/31/21 1054     (approximate)   History   Chief Complaint Covid Positive, Diarrhea, and Dizziness   HPI  Evan Watson is a 46 y.o. male with past medical history of hypertension and depression who presents to the ED complaining of diarrhea.  Patient reports that he initially tested positive for COVID-19 on January 1, initially felt well with only a mild cough.  Over the past 3 days, he has developed severe diarrhea.  He was seen by his PCP for this problem yesterday and told to take Imodium.  Diarrhea has only worsened since then and he reports having a watery bowel movement about every hour.  He has not noticed any blood in his stool and denies any abdominal pain, does report nausea but denies any vomiting.  He has not had any fevers or recent travel.  He last took a dose of Imodium around 3:00 this morning without significant relief.  He is not aware of any recent sick contacts.     Physical Exam   Triage Vital Signs: ED Triage Vitals [01/31/21 1027]  Enc Vitals Group     BP (!) 144/90     Pulse Rate 83     Resp 19     Temp 98.1 F (36.7 C)     Temp Source Oral     SpO2 97 %     Weight 250 lb (113.4 kg)     Height 5\' 7"  (1.702 m)     Head Circumference      Peak Flow      Pain Score      Pain Loc      Pain Edu?      Excl. in [place]?     Most recent vital signs: Vitals:   01/31/21 1027  BP: (!) 144/90  Pulse: 83  Resp: 19  Temp: 98.1 F (36.7 C)  SpO2: 97%    Constitutional: Alert and oriented. Eyes: Conjunctivae are normal. Head: Atraumatic. Nose: No congestion/rhinnorhea. Mouth/Throat: Mucous membranes are dry.  Cardiovascular: Normal rate, regular rhythm. Grossly normal heart sounds.  2+ radial pulses bilaterally. Respiratory: Normal respiratory effort.  No retractions. Lungs CTAB. Gastrointestinal: Soft and nontender. No  distention. Genitourinary: Deferred. Musculoskeletal: No lower extremity tenderness nor edema.  Neurologic:  Normal speech and language. No gross focal neurologic deficits are appreciated.    ED Results / Procedures / Treatments   Labs (all labs ordered are listed, but only abnormal results are displayed) Labs Reviewed  COMPREHENSIVE METABOLIC PANEL - Abnormal; Notable for the following components:      Result Value   Potassium 3.4 (*)    Glucose, Bld 101 (*)    BUN 21 (*)    All other components within normal limits  CBC - Abnormal; Notable for the following components:   WBC 13.8 (*)    All other components within normal limits  URINALYSIS, ROUTINE W REFLEX MICROSCOPIC - Abnormal; Notable for the following components:   Color, Urine YELLOW (*)    APPearance CLOUDY (*)    Hgb urine dipstick MODERATE (*)    Protein, ur 30 (*)    Bacteria, UA FEW (*)    All other components within normal limits  C DIFFICILE QUICK SCREEN W PCR REFLEX    GASTROINTESTINAL PANEL BY PCR, STOOL (REPLACES STOOL CULTURE)  LIPASE, BLOOD  MAGNESIUM     EKG  ED  ECG REPORT I, Blake Divine, the attending physician, personally viewed and interpreted this ECG.   Date: 01/31/2021  EKG Time: 10:31  Rate: 83  Rhythm: normal sinus rhythm  Axis: Normal  Intervals:none  ST&T Change: None   PROCEDURES:  Critical Care performed: No  Procedures   MEDICATIONS ORDERED IN ED: Medications  lactated ringers bolus 1,000 mL (0 mLs Intravenous Stopped 01/31/21 1309)  ondansetron (ZOFRAN) injection 4 mg (4 mg Intravenous Given 01/31/21 1140)  loperamide (IMODIUM) capsule 2 mg (2 mg Oral Given 01/31/21 1140)     IMPRESSION / MDM / ASSESSMENT AND PLAN / ED COURSE  I reviewed the triage vital signs and the nursing notes.                              46 y.o. male with past medical history of hypertension and depression who presents to the ED complaining of profuse watery diarrhea over the past 3 days  with some nausea but no abdominal pain or vomiting.  Differential diagnosis includes, but is not limited to, viral gastroenteritis, C. difficile colitis, inflammatory colitis, dehydration, electrolyte abnormality.  Patient is well-appearing with reassuring vital signs, no abdominal tenderness noted on exam.  Labs are pending we will assess for AKI or electrolyte abnormality as patient does appear slightly dehydrated.  We will attempt obtain stool sample to rule out C. difficile colitis, although low suspicion for this as patient has not been on any antibiotics recently.  We will treat symptomatically with IV fluids and IV Zofran, reassess following results.  Chart reviewed from patient's primary care visit yesterday, reassuring work-up at this time and he is status post Paxlovid treatment.  Stool studies are negative for C. difficile, additional labs are unremarkable with no electrolyte abnormality, LFTs within normal limits.  CBC shows mild leukocytosis which is nonspecific, no anemia noted.  Patient is feeling better following IV fluids and Zofran, diarrhea decreased following dose of loperamide.  He is appropriate for outpatient management and will be prescribed Zofran, counseled to take loperamide as needed at home.  He was counseled to follow-up with PCP and to return to the ED for new worsening symptoms, patient agrees with plan.       FINAL CLINICAL IMPRESSION(S) / ED DIAGNOSES   Final diagnoses:  COVID-19  Diarrhea, unspecified type  Dehydration     Rx / DC Orders   ED Discharge Orders          Ordered    ondansetron (ZOFRAN-ODT) 4 MG disintegrating tablet  Every 8 hours PRN        01/31/21 1448             Note:  This document was prepared using Dragon voice recognition software and may include unintentional dictation errors.   Blake Divine, MD 01/31/21 1451

## 2021-01-31 NOTE — ED Notes (Signed)
THIS TECH OBTAINED A FULL RAINBOW LAB SET. PT STATED TO BE ON "BLOOD THINNERS."

## 2021-08-30 DIAGNOSIS — K029 Dental caries, unspecified: Secondary | ICD-10-CM | POA: Insufficient documentation

## 2022-06-27 DIAGNOSIS — R7303 Prediabetes: Secondary | ICD-10-CM | POA: Insufficient documentation

## 2022-10-17 ENCOUNTER — Encounter: Payer: Self-pay | Admitting: *Deleted

## 2022-10-17 ENCOUNTER — Other Ambulatory Visit: Payer: Self-pay

## 2022-10-17 ENCOUNTER — Emergency Department: Payer: 59

## 2022-10-17 ENCOUNTER — Emergency Department
Admission: EM | Admit: 2022-10-17 | Discharge: 2022-10-17 | Disposition: A | Discharge status: Home / Self Care | Payer: 59 | Attending: Student in an Organized Health Care Education/Training Program | Admitting: Student in an Organized Health Care Education/Training Program

## 2022-10-17 DIAGNOSIS — J45909 Unspecified asthma, uncomplicated: Secondary | ICD-10-CM | POA: Insufficient documentation

## 2022-10-17 DIAGNOSIS — I10 Essential (primary) hypertension: Secondary | ICD-10-CM | POA: Diagnosis not present

## 2022-10-17 DIAGNOSIS — R0602 Shortness of breath: Secondary | ICD-10-CM | POA: Diagnosis present

## 2022-10-17 LAB — CBC
HCT: 43.1 % (ref 39.0–52.0)
Hemoglobin: 15.2 g/dL (ref 13.0–17.0)
MCH: 30 pg (ref 26.0–34.0)
MCHC: 35.3 g/dL (ref 30.0–36.0)
MCV: 85.2 fL (ref 80.0–100.0)
Platelets: 273 10*3/uL (ref 150–400)
RBC: 5.06 MIL/uL (ref 4.22–5.81)
RDW: 13 % (ref 11.5–15.5)
WBC: 8.1 10*3/uL (ref 4.0–10.5)
nRBC: 0 % (ref 0.0–0.2)

## 2022-10-17 LAB — BASIC METABOLIC PANEL
Anion gap: 9 (ref 5–15)
BUN: 23 mg/dL — ABNORMAL HIGH (ref 6–20)
CO2: 25 mmol/L (ref 22–32)
Calcium: 10.5 mg/dL — ABNORMAL HIGH (ref 8.9–10.3)
Chloride: 104 mmol/L (ref 98–111)
Creatinine, Ser: 1.24 mg/dL (ref 0.61–1.24)
GFR, Estimated: >60 mL/min (ref >60)
Glucose, Bld: 110 mg/dL — ABNORMAL HIGH (ref 70–99)
Potassium: 3.2 mmol/L — ABNORMAL LOW (ref 3.5–5.1)
Sodium: 138 mmol/L (ref 135–145)

## 2022-10-17 LAB — TROPONIN I (HIGH SENSITIVITY): Troponin I (High Sensitivity): 4 ng/L (ref <18)

## 2022-10-17 NOTE — ED Notes (Signed)
Patient verbalizes understanding of discharge instructions. Opportunity for questioning and answers were provided. Armband removed by staff, pt discharged from ED. Ambulated out to lobby

## 2022-10-17 NOTE — Discharge Instructions (Addendum)
Your exam, labs, EKG, and chest x-ray are all normal and reassuring at this time.  Your symptoms may be due to an acute asthma exacerbation versus environmental trigger including high heat humidity.  Continue to use your rescue inhaler and nebulizers at home as prescribed.  Follow-up with your primary provider or return to the ED if needed.

## 2022-10-17 NOTE — ED Provider Notes (Addendum)
[facility] Emergency Department Provider Note     Event Date/Time   First MD Initiated Contact with Patient 10/17/22 1832     (approximate)   History   Shortness of Breath   HPI  Evan Watson is a 47 y.o. male with a history of hypertension, anxiety, depression, and asthma, presents to the ED via EMS from work.  Patient presents due to an episode of shortness of breath.  He had a similar episode yesterday that resolved spontaneously.  Patient admits to working in [employer] where the environment is typically hot and humid.  He began to feel unwell with some fatigue and associated shortness of breath.  He uses rescue inhaler with subsequent benefit.  Patient presents despite feeling better with no acute distress and reports that his shortness of breath is now completely resolved.  He denies any nausea, vomiting, syncope, cough, or congestion.  No nausea, vomiting, or bowel changes noted.  Physical Exam   Triage Vital Signs: ED Triage Vitals  Encounter Vitals Group     BP 10/17/22 1654 132/68     Systolic BP Percentile --      Diastolic BP Percentile --      Pulse Rate 10/17/22 1654 81     Resp 10/17/22 1654 16     Temp 10/17/22 1654 98.8 F (37.1 C)     Temp Source 10/17/22 1654 Oral     SpO2 10/17/22 1654 95 %     Weight 10/17/22 1838 250 lb (113.4 kg)     Height 10/17/22 1838 5\' 7"  (1.702 m)     Head Circumference --      Peak Flow --      Pain Score 10/17/22 1654 4     Pain Loc --      Pain Education --      Exclude from Growth Chart --     Most recent vital signs: Vitals:   10/17/22 1654 10/17/22 1935  BP: 132/68 129/74  Pulse: 81 83  Resp: 16 18  Temp: 98.8 F (37.1 C) 98.3 F (36.8 C)  SpO2: 95% 97%    General Awake, no distress.  NAD HEENT NCAT. PERRL. EOMI. No rhinorrhea. Mucous membranes are moist.  CV:  Good peripheral perfusion.  RRR RESP:  Normal effort.  CTA ABD:  No distention.  Soft  ED Results  / Procedures / Treatments   Labs (all labs ordered are listed, but only abnormal results are displayed) Labs Reviewed  BASIC METABOLIC PANEL - Abnormal; Notable for the following components:      Result Value   Potassium 3.2 (*)    Glucose, Bld 110 (*)    BUN 23 (*)    Calcium 10.5 (*)    All other components within normal limits  CBC  TROPONIN I (HIGH SENSITIVITY)     EKG  Vent. rate 95 BPM PR interval 168 ms QRS duration 84 ms QT/QTcB 344/432 ms P-R-T axes 75 -9 30 Normal sinus rhythm Normal ECG When compared with ECG of 31-Jan-2021 10:31, Minimal criteria for Anterior infarct are no longer Present T wave inversion no longer evident in Inferior leads Nonspecific T wave abnormality no longer evident in Lateral leads  RADIOLOGY  I personally viewed and evaluated these images as part of my medical decision making, as well as reviewing the written report by the radiologist.  ED Provider Interpretation: No acute findings  DG Chest 2 View  Result Date: 10/17/2022 CLINICAL DATA:  Shortness of breath and chest tightness EXAM: CHEST - 2 VIEW COMPARISON:  Of the the none there are FINDINGS: The heart size and mediastinal contours are within normal limits. Both lungs are clear. The visualized skeletal structures are unremarkable. IMPRESSION: No active cardiopulmonary disease. Electronically Signed   By: Darliss Cheney M.D.   On: 10/17/2022 18:55     PROCEDURES:  Critical Care performed: No  Procedures   MEDICATIONS ORDERED IN ED: Medications - No data to display   IMPRESSION / MDM / ASSESSMENT AND PLAN / ED COURSE  I reviewed the triage vital signs and the nursing notes.                              Differential diagnosis includes, but is not limited to, viral syndrome, bronchitis including COPD exacerbation, pneumonia, reactive airway disease including asthma, CHF including exacerbation with or without pulmonary/interstitial edema, pneumothorax, ACS, thoracic trauma,  and pulmonary embolism.   Patient's presentation is most consistent with acute complicated illness / injury requiring diagnostic workup.  Patient's diagnosis is consistent with likely asthma exacerbation causing shortness of breath.  Patient with a reassuring exam and workup at this time.  No evidence of an acute cardiac cause, no electrolyte abnormalities noted.  No malignant arrhythmia on EKG.  No intrathoracic process on his x-ray.  He notes symptoms at this time have all but resolved.  Patient will be discharged home with instructions to use his inhalers and nebulizers as prescribed. Patient is to follow up with his primary provider as discussed, as needed or otherwise directed. Patient is given ED precautions to return to the ED for any worsening or new symptoms.   FINAL CLINICAL IMPRESSION(S) / ED DIAGNOSES   Final diagnoses:  SOB (shortness of breath)     Rx / DC Orders   ED Discharge Orders     None        Note:  This document was prepared using Dragon voice recognition software and may include unintentional dictation errors.    Lissa Hoard, PA-C 10/17/22 1936    Lissa Hoard, [place] 10/17/22 1936    Willy Eddy, MD 10/17/22 1944

## 2022-10-17 NOTE — ED Triage Notes (Signed)
First nurse note: Pt to ED [facility] from work for shob, 96% on RA.  20g EMS 324 ASA Was c/o chest tightness.  Similar episode yesterday at work .

## 2022-10-17 NOTE — ED Triage Notes (Signed)
Pt brought in by EMS from work due to episode of sob.  Pt works in a [employer] in hot and humid environment under physically demanding conditions when he began feeling unwell with fatigue and sob.  Pt is wet with sweat but reports that this is usual due to the nature of his work.  Pt states that he had similar episode yesterday but it was not as significant and he was able to stay at work.  Pt is feeling better, no distress, sob is resolved but pt still feels a little tightness in his chest.

## 2022-12-05 NOTE — Progress Notes (Signed)
PUO

## 2023-04-14 NOTE — Progress Notes (Deleted)
 Psychiatric Initial Adult Assessment   Patient Identification: Evan Watson MRN:  [id number] Date of Evaluation:  04/14/2023 Referral Source: *** Chief Complaint:  No chief complaint on file.  Visit Diagnosis: No diagnosis found.  History of Present Illness:   Evan Watson is a 48 y.o. year old male with a history of depression, hypertension, elevated LDL, prediabetes, class III obesity, who is referred for depression.     On venlafaxine     Associated Signs/Symptoms: Depression Symptoms:  {DEPRESSION SYMPTOMS:20000} (Hypo) Manic Symptoms:  {BHH MANIC SYMPTOMS:22872} Anxiety Symptoms:  {BHH ANXIETY SYMPTOMS:22873} Psychotic Symptoms:  {BHH PSYCHOTIC SYMPTOMS:22874} PTSD Symptoms: {BHH PTSD SYMPTOMS:22875}  Past Psychiatric History: ***  Previous Psychotropic Medications: {YES/NO:21197}  Substance Abuse History in the last 12 months:  {yes no:314532}  Consequences of Substance Abuse: {BHH CONSEQUENCES OF SUBSTANCE ABUSE:22880}  Past Medical History:  Past Medical History:  Diagnosis Date   Depression    Hypertension    No past surgical history on file.  Family Psychiatric History: ***  Family History: No family history on file.  Social History:   Social History   Socioeconomic History   Marital status: Married    Spouse name: Not on file   Number of children: Not on file   Years of education: Not on file   Highest education level: Not on file  Occupational History   Not on file  Tobacco Use   Smoking status: Never   Smokeless tobacco: Never  Vaping Use   Vaping status: Never Used  Substance and Sexual Activity   Alcohol use: Yes   Drug use: Never   Sexual activity: Not on file  Other Topics Concern   Not on file  Social History Narrative   Not on file   Social Drivers of Health   Financial Resource Strain: High Risk (06/27/2022)   Received from [facility]   Overall Financial Resource Strain (CARDIA)    Difficulty of  Paying Living Expenses: Very hard  Food Insecurity: Food Insecurity Present (06/27/2022)   Received from [facility]   Hunger Vital Sign    Worried About Running Out of Food in the Last Year: Often true    Ran Out of Food in the Last Year: Sometimes true  Transportation Needs: Unmet Transportation Needs (06/27/2022)   Received from [facility] - Transportation    In the past 12 months, has lack of transportation kept you from medical appointments or from getting medications?: Yes    Lack of Transportation (Non-Medical): No  Physical Activity: Not on file  Stress: Not on file  Social Connections: Not on file    Additional Social History: ***  Allergies:  No Known Allergies  Metabolic Disorder Labs: No results found for: "HGBA1C", "MPG" No results found for: "PROLACTIN" No results found for: "CHOL", "TRIG", "HDL", "CHOLHDL", "VLDL", "LDLCALC" No results found for: "TSH"  Therapeutic Level Labs: No results found for: "LITHIUM" No results found for: "CBMZ" No results found for: "VALPROATE"  Current Medications: Current Outpatient Medications  Medication Sig Dispense Refill   albuterol (VENTOLIN HFA) 108 (90 Base) MCG/ACT inhaler Inhale 1-2 puffs into the lungs every 6 (six) hours as needed for wheezing or shortness of breath.     amLODipine (NORVASC) 10 MG tablet Take 10 mg by mouth daily.     hydrochlorothiazide (HYDRODIURIL) 25 MG tablet Take 25 mg by mouth daily.     hydrOXYzine (ATARAX/VISTARIL) 25 MG tablet Take 25 mg by  mouth 3 (three) times daily.     losartan (COZAAR) 100 MG tablet Take 100 mg by mouth daily.     Multiple Vitamin (MULTI-VITAMIN) tablet Take 1 tablet by mouth daily.     ondansetron (ZOFRAN-ODT) 4 MG disintegrating tablet Take 1 tablet (4 mg total) by mouth every 8 (eight) hours as needed for nausea or vomiting. 12 tablet 0   tamsulosin (FLOMAX) 0.4 MG CAPS capsule Take 0.4 mg by mouth daily.     Venlafaxine HCl  225 MG TB24 Take 225 mg by mouth daily.     Current Facility-Administered Medications  Medication Dose Route Frequency Provider Last Rate Last Admin   triamcinolone acetonide (KENALOG) 10 MG/ML injection 10 mg  10 mg Other Once Asencion Islam, DPM        Musculoskeletal: Strength & Muscle Tone: {desc; muscle tone:32375} Gait & Station: {PE GAIT ED XBMW:41324} Patient leans: {Patient Leans:21022755}  Psychiatric Specialty Exam: Review of Systems  There were no vitals taken for this visit.There is no height or weight on file to calculate BMI.  General Appearance: {Appearance:22683}  Eye Contact:  {BHH EYE CONTACT:22684}  Speech:  {Speech:22685}  Volume:  {Volume (PAA):22686}  Mood:  {BHH MOOD:22306}  Affect:  {Affect (PAA):22687}  Thought Process:  {Thought Process (PAA):22688}  Orientation:  {BHH ORIENTATION (PAA):22689}  Thought Content:  {Thought Content:22690}  Suicidal Thoughts:  {ST/HT (PAA):22692}  Homicidal Thoughts:  {ST/HT (PAA):22692}  Memory:  {BHH MEMORY:22881}  Judgement:  {Judgement (PAA):22694}  Insight:  {Insight (PAA):22695}  Psychomotor Activity:  {Psychomotor (PAA):22696}  Concentration:  {Concentration:21399}  Recall:  {BHH GOOD/FAIR/POOR:22877}  Fund of Knowledge:{BHH GOOD/FAIR/POOR:22877}  Language: {BHH GOOD/FAIR/POOR:22877}  Akathisia:  {BHH YES OR NO:22294}  Handed:  {Handed:22697}  AIMS (if indicated):  {Desc; done/not:10129}  Assets:  {Assets (PAA):22698}  ADL's:  {BHH MWN'U:27253}  Cognition: {chl bhh cognition:304700322}  Sleep:  {BHH GOOD/FAIR/POOR:22877}   Screenings: Flowsheet Row ED from 10/17/2022 in [facility] Emergency Department at [facility] ED from 01/31/2021 in [facility] Emergency Department at [facility]  C-SSRS RISK CATEGORY No Risk No Risk       Assessment and Plan: ***  Collaboration of Care: {BH OP Collaboration of Care:21014065}  Patient/Guardian was advised Release of Information must be obtained prior  to any record release in order to collaborate their care with an outside provider. Patient/Guardian was advised if they have not already done so to contact the registration department to sign all necessary forms in order for [place] to release information regarding their care.   Consent: Patient/Guardian gives verbal consent for treatment and assignment of benefits for services provided during this visit. Patient/Guardian expressed understanding and agreed to proceed.   Neysa Hotter, MD 3/23/20258:37 AM

## 2023-04-17 ENCOUNTER — Ambulatory Visit: Payer: Self-pay | Admitting: Psychiatry

## 2023-05-02 ENCOUNTER — Ambulatory Visit (INDEPENDENT_AMBULATORY_CARE_PROVIDER_SITE_OTHER): Admitting: Psychiatry

## 2023-05-02 ENCOUNTER — Ambulatory Visit: Payer: Self-pay | Admitting: Psychiatry

## 2023-05-02 DIAGNOSIS — F32A Depression, unspecified: Secondary | ICD-10-CM

## 2023-05-02 NOTE — Progress Notes (Signed)
 Conflict of interest. Switch providers.

## 2023-05-16 ENCOUNTER — Ambulatory Visit: Admitting: Psychiatry
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
## 2023-05-21 ENCOUNTER — Ambulatory Visit: Admitting: Psychiatry

## 2023-05-21 ENCOUNTER — Encounter: Payer: Self-pay | Admitting: Psychiatry

## 2023-05-21 ENCOUNTER — Other Ambulatory Visit: Payer: Self-pay

## 2023-05-21 VITALS — BP 139/89 | HR 75 | Temp 97.4°F | Ht 67.0 in | Wt 269.2 lb

## 2023-05-21 DIAGNOSIS — R41840 Attention and concentration deficit: Secondary | ICD-10-CM

## 2023-05-21 DIAGNOSIS — F411 Generalized anxiety disorder: Secondary | ICD-10-CM

## 2023-05-21 DIAGNOSIS — F331 Major depressive disorder, recurrent, moderate: Secondary | ICD-10-CM

## 2023-05-21 MED ORDER — ESCITALOPRAM OXALATE 10 MG PO TABS: ORAL_TABLET | ORAL | 1 refills | Status: DC

## 2023-05-21 NOTE — Patient Instructions (Addendum)
 Start lexapro 5 mg daily for one week, then 10 mg daily  Obtain lab (TSH, free T4) Next appointment: 6/12 at 8 AM

## 2023-05-21 NOTE — Addendum Note (Signed)
 Addended by: Romyn Boswell on: 05/21/2023 12:57 PM   Modules accepted: Level of Service

## 2023-06-14 ENCOUNTER — Ambulatory Visit: Payer: Self-pay | Admitting: Psychiatry

## 2023-06-14 LAB — T4, FREE: Free T4: 1.02 ng/dL (ref 0.82–1.77)

## 2023-06-14 LAB — TSH: TSH: 2.15 u[IU]/mL (ref 0.450–4.500)
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
## 2023-07-04 ENCOUNTER — Ambulatory Visit: Admitting: Psychiatry

## 2023-07-04 ENCOUNTER — Encounter: Payer: Self-pay | Admitting: Psychiatry

## 2023-07-04 VITALS — BP 136/82 | HR 83 | Temp 98.4°F | Ht 67.0 in | Wt 259.8 lb

## 2023-07-04 DIAGNOSIS — F411 Generalized anxiety disorder: Secondary | ICD-10-CM

## 2023-07-04 DIAGNOSIS — F32A Depression, unspecified: Secondary | ICD-10-CM | POA: Insufficient documentation

## 2023-07-04 DIAGNOSIS — F331 Major depressive disorder, recurrent, moderate: Secondary | ICD-10-CM | POA: Diagnosis not present

## 2023-07-04 MED ORDER — ESCITALOPRAM OXALATE 20 MG PO TABS: 20.0000 mg | ORAL_TABLET | Freq: Every day | ORAL | 1 refills | Status: DC

## 2023-07-04 NOTE — Patient Instructions (Addendum)
 Increase lexapro  20 mg daily  Next appointment: 7/28 at 8 AM

## 2023-07-21 ENCOUNTER — Other Ambulatory Visit: Payer: Self-pay | Admitting: Psychiatry
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
## 2023-08-19 ENCOUNTER — Ambulatory Visit: Admitting: Psychiatry

## 2023-08-19 ENCOUNTER — Encounter: Payer: Self-pay | Admitting: Psychiatry

## 2023-08-19 VITALS — BP 130/86 | HR 73 | Temp 98.3°F | Ht 67.0 in | Wt 258.0 lb

## 2023-08-19 DIAGNOSIS — F331 Major depressive disorder, recurrent, moderate: Secondary | ICD-10-CM | POA: Diagnosis not present

## 2023-08-19 DIAGNOSIS — F411 Generalized anxiety disorder: Secondary | ICD-10-CM

## 2023-08-19 DIAGNOSIS — R41840 Attention and concentration deficit: Secondary | ICD-10-CM

## 2023-08-19 MED ORDER — ESCITALOPRAM OXALATE 20 MG PO TABS: 20.0000 mg | ORAL_TABLET | Freq: Every day | ORAL | 3 refills | Status: DC

## 2023-08-19 MED ORDER — BUSPIRONE HCL 5 MG PO TABS: 5.0000 mg | ORAL_TABLET | Freq: Two times a day (BID) | ORAL | 1 refills | Status: DC

## 2023-08-19 NOTE — Patient Instructions (Signed)
 Continue lexapro  20 mg daily  Start Buspar  5 mg twice a day Next appointment: 9/9 at 8 am
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
## 2023-10-01 ENCOUNTER — Ambulatory Visit: Admitting: Psychiatry
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
## 2023-10-02 ENCOUNTER — Encounter: Payer: Self-pay | Admitting: Psychiatry

## 2023-10-02 ENCOUNTER — Telehealth (INDEPENDENT_AMBULATORY_CARE_PROVIDER_SITE_OTHER): Admitting: Psychiatry

## 2023-10-02 DIAGNOSIS — R41840 Attention and concentration deficit: Secondary | ICD-10-CM

## 2023-10-02 DIAGNOSIS — F411 Generalized anxiety disorder: Secondary | ICD-10-CM | POA: Diagnosis not present

## 2023-10-02 DIAGNOSIS — F330 Major depressive disorder, recurrent, mild: Secondary | ICD-10-CM | POA: Diagnosis not present

## 2023-10-02 DIAGNOSIS — G4700 Insomnia, unspecified: Secondary | ICD-10-CM | POA: Diagnosis not present

## 2023-10-02 MED ORDER — BUSPIRONE HCL 5 MG PO TABS: 5.0000 mg | ORAL_TABLET | Freq: Two times a day (BID) | ORAL | 1 refills | Status: DC

## 2023-10-02 NOTE — Patient Instructions (Signed)
 Plan Continue lexapro  20 mg daily  Continue Buspar  5 mg twice a day Next appointment: 11/6 at 8 30

## 2023-11-06 ENCOUNTER — Encounter: Payer: Self-pay | Admitting: Psychiatry

## 2023-11-06 ENCOUNTER — Telehealth: Payer: Self-pay | Admitting: Psychiatry

## 2023-11-06 ENCOUNTER — Telehealth: Admitting: Psychiatry

## 2023-11-06 DIAGNOSIS — G4700 Insomnia, unspecified: Secondary | ICD-10-CM

## 2023-11-06 DIAGNOSIS — F330 Major depressive disorder, recurrent, mild: Secondary | ICD-10-CM

## 2023-11-06 DIAGNOSIS — R41840 Attention and concentration deficit: Secondary | ICD-10-CM

## 2023-11-06 DIAGNOSIS — F411 Generalized anxiety disorder: Secondary | ICD-10-CM

## 2023-11-06 NOTE — Telephone Encounter (Signed)
 A letter excusing the patient from jury duty has been prepared. Please print and provide it to the patient after obtaining a signed ROI. Thanks

## 2023-11-06 NOTE — Patient Instructions (Signed)
 Continue lexapro  20 mg daily  Increase Buspar  5 mg twice a day  Next appointment: 11/6 at 8 30

## 2023-11-06 NOTE — Telephone Encounter (Signed)
 Patient signed ROI for jury excuse letter. ROI scanned, letter emailed to patient per request
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
## 2023-11-28 ENCOUNTER — Encounter: Payer: Self-pay | Admitting: Psychiatry

## 2023-11-28 ENCOUNTER — Ambulatory Visit (INDEPENDENT_AMBULATORY_CARE_PROVIDER_SITE_OTHER): Admitting: Psychiatry

## 2023-11-28 VITALS — BP 130/88 | HR 50 | Temp 98.2°F | Ht 67.5 in | Wt 255.0 lb

## 2023-11-28 DIAGNOSIS — F330 Major depressive disorder, recurrent, mild: Secondary | ICD-10-CM | POA: Diagnosis not present

## 2023-11-28 DIAGNOSIS — R41840 Attention and concentration deficit: Secondary | ICD-10-CM

## 2023-11-28 DIAGNOSIS — F411 Generalized anxiety disorder: Secondary | ICD-10-CM

## 2023-11-28 NOTE — Patient Instructions (Signed)
 Continue lexapro  20 mg daily  Continue Buspar  5 mg twice a day  Referral for ADHD Next appointment: 12/23 at 10:30

## 2023-11-28 NOTE — Addendum Note (Signed)
 Addended by: Kaylinn Dedic on: 11/28/2023 01:02 PM   Modules accepted: Orders

## 2023-12-05 ENCOUNTER — Telehealth (HOSPITAL_COMMUNITY): Payer: Self-pay | Admitting: *Deleted

## 2023-12-05 NOTE — Telephone Encounter (Signed)
 Patient called office yesterday (12/04/23) around 4pm. Patient only left his name and his phone number but did not leave a detail message and hung up.   Staff called patient back on 12/05/23 and was not able to reach patient. Automatic system asked questions asking the matter of the call and patient let message that way. Office number was provided on voicemail.
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
## 2023-12-05 NOTE — Telephone Encounter (Signed)
 Called patient to get more information about what is going on explained to patient that provider is seeing scheduled patients today he voiced understanding  He explained that his mind is swirling constantly and this was talked about in his last appointment and also changing his medication. He had a situation at work on yesterday he feels he over reacted in his speech and there was some anger he is still taking his medication as prescribed but wanted the provider to know what happened and know that he is still dealing with his mind  swirling please advise

## 2023-12-05 NOTE — Telephone Encounter (Signed)
 I called to address his concern, but reached an automated message and was unable to speak with him or leave a voicemail. When he calls back, please advise him to speak with the front desk to arrange a sooner appointment, and reach out to emergency resources for immediate support if needed.

## 2023-12-06 ENCOUNTER — Telehealth: Admitting: Psychiatry

## 2023-12-06 ENCOUNTER — Encounter: Payer: Self-pay | Admitting: Psychiatry

## 2023-12-06 DIAGNOSIS — R41840 Attention and concentration deficit: Secondary | ICD-10-CM | POA: Diagnosis not present

## 2023-12-06 DIAGNOSIS — F411 Generalized anxiety disorder: Secondary | ICD-10-CM

## 2023-12-06 DIAGNOSIS — F330 Major depressive disorder, recurrent, mild: Secondary | ICD-10-CM | POA: Diagnosis not present

## 2023-12-06 DIAGNOSIS — G4700 Insomnia, unspecified: Secondary | ICD-10-CM

## 2023-12-06 MED ORDER — ESCITALOPRAM OXALATE 20 MG PO TABS: 20.0000 mg | ORAL_TABLET | Freq: Every day | ORAL | 3 refills | Status: AC

## 2023-12-06 MED ORDER — BUSPIRONE HCL 10 MG PO TABS: 10.0000 mg | ORAL_TABLET | Freq: Two times a day (BID) | ORAL | 1 refills | Status: DC

## 2023-12-06 NOTE — Patient Instructions (Signed)
 Continue lexapro  20 mg daily  Increase Buspar  10 mg twice a day Referred for ADHD Next appointment: 12/30 at 9 am

## 2023-12-30 ENCOUNTER — Other Ambulatory Visit: Payer: Self-pay | Admitting: Psychiatry
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
## 2024-01-14 ENCOUNTER — Ambulatory Visit: Admitting: Psychiatry

## 2024-01-21 ENCOUNTER — Ambulatory Visit: Admitting: Psychiatry

## 2024-01-21 ENCOUNTER — Other Ambulatory Visit: Payer: Self-pay

## 2024-01-21 ENCOUNTER — Encounter: Payer: Self-pay | Admitting: Psychiatry

## 2024-01-21 VITALS — BP 144/89 | HR 83 | Temp 97.8°F | Ht 67.5 in | Wt 258.8 lb

## 2024-01-21 DIAGNOSIS — F330 Major depressive disorder, recurrent, mild: Secondary | ICD-10-CM

## 2024-01-21 DIAGNOSIS — F411 Generalized anxiety disorder: Secondary | ICD-10-CM

## 2024-01-21 DIAGNOSIS — R41840 Attention and concentration deficit: Secondary | ICD-10-CM

## 2024-01-21 MED ORDER — BUSPIRONE HCL 10 MG PO TABS: 10.0000 mg | ORAL_TABLET | Freq: Two times a day (BID) | ORAL | 1 refills | Status: DC

## 2024-01-21 NOTE — Patient Instructions (Signed)
 Continue lexapro  20 mg daily  Continue Buspar  10 mg twice a day  Next appointment: 2/13 at 9 am

## 2024-01-30 ENCOUNTER — Ambulatory Visit: Admitting: Psychology

## 2024-02-11 ENCOUNTER — Other Ambulatory Visit: Payer: Self-pay | Admitting: Psychiatry

## 2024-02-14 ENCOUNTER — Ambulatory Visit: Admitting: Psychology

## 2024-02-14 DIAGNOSIS — F89 Unspecified disorder of psychological development: Secondary | ICD-10-CM | POA: Diagnosis not present
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
## 2024-02-20 ENCOUNTER — Other Ambulatory Visit: Payer: Self-pay | Admitting: Psychiatry

## 2024-02-20 ENCOUNTER — Ambulatory Visit: Admitting: Psychology

## 2024-02-20 DIAGNOSIS — F89 Unspecified disorder of psychological development: Secondary | ICD-10-CM

## 2024-02-20 NOTE — Telephone Encounter (Signed)
 Called pharmacy spoke to [place] she stated that they did not receive a Rx for the Buspar  and would like for a new one to be sent over

## 2024-02-20 NOTE — Telephone Encounter (Signed)
 There should be a new order of buspar . Please verify with them. Thanks.

## 2024-02-26 ENCOUNTER — Ambulatory Visit: Admitting: Psychology

## 2024-02-26 DIAGNOSIS — F902 Attention-deficit hyperactivity disorder, combined type: Secondary | ICD-10-CM

## 2024-02-26 DIAGNOSIS — F4310 Post-traumatic stress disorder, unspecified: Secondary | ICD-10-CM

## 2024-02-26 NOTE — Progress Notes (Signed)
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 Low Average  Composite Memory 120 91 Yes Above  Verbal Memory 109 73 Yes Average  Visual Memory 122 93 Yes Above  Psychomotor Speed 118 88 Yes Above  Reaction Time 106 66 Yes Average  Complex Attention 51 1 Yes Very Low  Cognitive Flexibility 51 1 Yes Very Low  Processing Speed  94 34 Yes Average  Executive Function 50 1 Yes Very Low  Working Memory 101 53 Yes Average  Sustained Attention 91 27 Yes Average  Simple Attention 107 68 Yes Average  Motor Speed 128 97 Yes Above   EXECUTIVE FUNCTION  Behavior Rating Inventory of Executive Function, Second Edition EVENT ORGANISER) Self-Report: Mr. Favia completed the Self-Report Form of the Behavior Rating Inventory of Executive Function-Adult Version, Second Edition KIMBERLY-CLARK), which has three domains that evaluate cognitive, behavioral, and emotional regulation, and a Global Executive Composite score provides an overall snapshot of executive functioning. There are no missing item responses in the protocol. The Negativity, Infrequency, and Inconsistency scales are not  elevated, suggesting he did not respond to the protocol in an overly negative, haphazard, extreme, or inconsistent manner. In the context of these validity considerations, ratings of Mr. Laufer everyday executive function suggest some areas of concern. The overall index score, the GEC, was moderately elevated (GEC T = 70, %ile = 97). The Behavior Regulation Index (BRI), Emotion Regulation Index (ERI), and Cognitive Regulation Index (CRI) scores were all elevated (BRI T = 65, %ile = 95; ERI T = 66, %ile = 91, CRI T = 70, %ile = 98), suggesting self-regulatory problems in multiple domains. Mr. Livecchi indicated difficulty with his ability to resist impulses, adjust well to changes, get going on tasks and activities and independently generate ideas, sustain working memory, plan and organize his approach to problem solving appropriately, and keep materials and belongings reasonably well-organized. He did not describe his ability to be aware of his functioning in social settings, react to events appropriately, and be appropriately cautious in his approach to tasks and check for mistakes as problematic. However, the Emotional Control Scale approached an abnormal elevation.   Scale/Index  Raw Score T Score Percentile Qualitative Description  Inhibit 15 68 >99 Mildly Elevated  Self-Monitor 9 58 87 Within Normal Limits  Behavior Regulation Index (BRI) 24 65 95 Mildly Elevated  Shift 12 68 98 Mildly Elevated  Emotional Control 14 62 90 Approaching an Elevation  Emotion Regulation Index (ERI) 26 66 91 Mildly Elevated  Initiate 17 70 98 Moderately Elevated  Working Memory 19 79 >99 Highly Elevated  Plan/Organize 15 65 94 Mildly Elevated  Task Monitor 8 49 58 Within Normal Limits  Organization of Materials 17 68 96 Mildly Elevated  Cognitive Regulation Index (CRI) 76 70 98 Moderately Elevated  Global Executive Composite (GEC) 126 70 97 Moderately Elevated   Validity Scale Raw Score Cumulative Percentile  Protocol Classification  Inconsistency 5 98 Acceptable  Negativity 0 98 Acceptable  Infrequency 0 98 Acceptable   Behavior  Rating Inventory of Executive Function, Second Edition KIMBERLY-CLARK) Informant:  Mr. Stueve ex-wife, Ms. Jon Blush, completed the Informant Form of the Behavior Rating Inventory of Executive Function-Adult Version, Second Edition [facility]), which is equivalent to the Self-Report version and has three domains that evaluate cognitive, behavioral, and emotional regulation, and a Global Executive Composite score provides an overall snapshot of executive functioning. There are no missing item responses in the protocol. The Negativity, Infrequency, and Inconsistency scales are not elevated, suggesting she did not respond to the protocol in an overly negative, haphazard, extreme, or inconsistent manner. In the context of these validity considerations, Ms. Tollison's ratings of Mr. Schrecengost 's everyday executive function suggest some areas of concern. The overall index score, the GEC, was highly elevated (GEC T = 83, %ile = >99). The Behavior Regulation Index (BRI), Emotion Regulation Index (ERI), and Cognitive Regulation Index (CRI) scores were all elevated (BRI T = 78, %ile = >99; ERI T = 66, %ile = 94, CRI T = 85, %ile = >99), suggesting self-regulatory problems in multiple domains. Ms. Smeltzer indicated Mr. Gosnell has trouble with his ability to resist impulses, be aware of his functioning in social settings, adjust well to changes, sustain working memory, plan and organize his approach to problem solving appropriately, be appropriately cautious in his approach to tasks and check for mistakes, and keep materials and belongings reasonably well-organized. She did not describe his ability to appropriately react to events, get going on tasks and activities, or independently generate ideas as problematic. However, the Initiate scale approached an abnormal elevation.  Scale/Index  Raw Score T  Score Percentile Qualitative Description  Inhibit 20 82 >99 Highly Elevated  Self-Monitor 12 65 96 Mildly Elevated  Behavior Regulation Index (BRI) 32 78 >99 Highly Elevated  Shift 13 66 >99 Mildly Elevated  Emotional Control 14 58 86 Within Normal Limits  Emotion Regulation Index (ERI) 27 66 94 Mildly Elevated  Initiate 14 64 95 Approaching an Elevation  Working Memory 24 95 >99 Highly Elevated  Plan/Organize 18 75 99 Highly Elevated  Task-Monitor 14 71 >99 Moderately Elevated  Organization of Materials 23 80 99 Highly Elevated  Cognitive Regulation Index (CRI) 93 85 >99 Highly Elevated  Global Executive Composite (GEC) 152 83 >99 Highly Elevated   Validity Scale Raw Score Cumulative Percentile Protocol Classification  Inconsistency 4 99 Acceptable  Negativity 3 98 Acceptable  Infrequency 0 98 Acceptable   BEHAVIORAL FUNCTIONING   Patient Health Questionnaire-9 (PHQ-9): Mr. Fuelling completed the PHQ-9, a self-report measure that assesses symptoms of depression. He scored 12/27, which indicates moderate depression.   Generalized Anxiety Disorder-7 (GAD-7): Mr. Vanderloop completed the GAD-7, a self-report measure that assesses symptoms of anxiety. He scored 10/21, which indicates moderate anxiety.   Adult ADHD Self-Report Scale Symptom Checklist (ASRS): Mr. Eckrich reported the following symptoms as sometimes: problems remembering appointments or obligations, avoiding or delaying getting started on tasks requiring a lot of thought, being distracted by the noise around him, difficulty relaxing, and interrupting others when they are busy. He endorsed the following symptoms as occurring often: difficulty wrapping up the final details of a project following the completion of challenging aspects, feeling overly active and compelled to do things, struggling to sustain attention when doing boring or repetitive work, difficulty waiting for his turn in turn-taking situations. He endorsed the following  symptoms as very often: fidgeting or squirming, struggling to concentrate on what people say even when they are speaking directly to him, misplacing or has difficulty finding  things, and feeling restless or fidgety. The endorsement of at least four items in Part A is highly consistent with ADHD in adults. The frequency scores of Part B provides additional cues. Mr. Hurta scored 4/6 on Part A and 6/12 on Part B, which is considered a positive screening for ADHD.   PTSD Checklist for DSM-5 (PCL-5): The PCL-5 was administered. Mr. Trivedi scored 56/19, and indicated meeting full criteria for PTSD. He endorsed repeated, disturbing, and unwanted memories of the stressful experience (quite a bit); repeated, disturbing dreams of the stressful experience (moderately); flashbacks (moderately); feeling very upset when something reminds you of the stressful experience (quite a bit); having strong physical reactions when something reminds you of the stressful experience (moderately); avoiding memories, thoughts, or feelings related to the stressful experience (quite a bit); avoiding external reminders of the stressful experience (moderately); trouble remembering important parts of the stressful experience (moderately); having strong negative beliefs about yourself, other people, or the world (a little bit); blaming yourself or someone else for the stressful experience or what happened after it (quite a bit); having strong negative feelings such as fear, horror, anger, guilt, or shame (quite a bit); loss of interest in activities you used to enjoy (quite a bit); feeling distant or cut off from other people (moderately); trouble experiencing positive feelings (moderately); irritable behavior, angry outbursts, or acting aggressively (moderately); taking too many risks or doing things that could cause you harm (not at all); being superalert or watchful or on guard (not at all); feeling jumpy or easily startled (moderately);  having difficulty concentrating (quite a bit); and trouble falling or staying asleep (quite a bit).  Personality Assessment Inventory (PAI): The PAI is an objective inventory of adult personality. The validity indicators suggested Mr. Burling profile responded consistently to similar item content (ICN T = 46), attended appropriately to items (INF T = 47), and did not appear to attempt to portray himself in an unrealistically favorable manner (PIM T = 57); However, there was an element of exaggeration of complaints and problems (NIM T = 81). As such, clinical test findings may overrepresent the extent and degree of the endorsed concerns, and this evaluator only broadly interpreted his significant results. He endorsed prominent unhappiness and dysphoria (DEP T = 72), a moderate degree of stress as a result of difficulties in some major life area (STR T = 64), feeling isolated and misunderstood by other (SCZ-S T = 69), and having problems with concentration and decisions making (SCZ-T T = 75). Upon follow-up, Mr. Lybbert shared that he tends to be very critical of [himself], which may at least partially explain the elevated NIM scale.    SUMMARY AND CLINICAL IMPRESSIONS: Mr. Deeg is a 49 year old male who was referred by [name] for an evaluation to determine if he currently meets criteria for a diagnosis of Attention-Deficit/Hyperactivity Disorder (ADHD).   Mr. Duckett reported a history of ADHD-related symptoms that have persisted despite use of Buspar  and escitalopram . He also reported multiple others, including others who have self-identified as having ADHD, have expressed concerns he has ADHD, which led to him pursuing this evaluation. He expressed a belief that his ADHD-related symptoms have occurred since childhood and are consistent and independent of mood and hypervigilant states, but can be exacerbated by mood (e.g., grief) and situations (e.g., reduced sleep).  Mr. Errico was  administered assessments during the evaluation to measure his current cognitive abilities. His verbal comprehension abilities were in the average range and comparably developed. His ability  to sustain attention, concentrate, and exert mental control was in the low average range, which suggests he experiences a weakness in these abilities. Moreover, his performance pattern on the [facility] subtests suggests he best utilizes working memory when performing active mental manipulation on the registered and maintained information rather than actively maintaining, refreshing, and updating information. Results of the CNS Vital Signs indicated a low average neurocognitive processing ability, with impairment in complex attention, cognitive flexibility, and executive function, and strengths in visual memory, psychomotor speed, and motor speed.   During the clinical interview and on self-report measures, Mr. Sanger endorsed executive functioning concerns that include attentional dysregulation, hyperactivity- and impulsivity-related symptoms, and meeting the full criteria for ADHD. Moreover, his ex-wife, Izreal Kock, indicated perceiving him as experiencing multiple significant executive functioning issues. While PAI validity concerns make creating a diagnostic conclusion difficult, when considering self-reported symptoms; Ms. Maclellan perception of his executive functioning abilities; endorsed and/or demonstrated impairment or weakness on measures of attention, executive functioning, and working memory; his therapist and others that have self-identified as having ADHD reportedly expressing concerns he has ADHD as well; and a possible familial history of ADHD, a diagnosis of F90.2 Attention-Deficit/Hyperactivity Disorder, Combined Presentation, Moderate appears warranted. The specifier of Moderate was assigned as he endorsed symptoms in excess of what is needed to make the diagnosis and indicated they cause impairment or  difficulties in his academic (e.g., regularly having trouble sustaining his attention in class), social (e.g., frequently engaging in excessive talking and interrupting of others), and daily (e.g., regularly experiencing attentional dysregulation, task initiation and completion issues, disorganization, and forgetfulness) functioning.   Mr. Wallman discussed a history of autism spectrum disorder-related symptomatology, depressive episodes and generalized anxiety that he utilizes psychotropic medication to assist in managing, social anxiety-related symptomatology, sleep maintenance issues and fluctuating restfulness upon waking, regularly tossing and turning in his sleep and his ex-wife indicating he experiences sleep apnea-related symptoms, seemingly random periods of ravenous hunger and eating significantly more than usual but never fe[eling] full, and trauma- and stressor-related disorder symptomatology. The GAD-7, PHQ-9, PCL-5, and PAI were administered. His results suggest he experiences moderate depression- and anxiety-related symptomatology, and meets full criteria for PTSD. When considering clinical interview information and his PCL-5 results, a diagnosis of F43.10 Posttraumatic Stress Disorder appears warranted. Given the limited scope of this evaluation, it was not possible to determine if the full criteria for autism spectrum disorder, depressive disorder, anxiety disorder(s), sleep-wake disorder, or eating disorder are met, or if his diagnosis of ADHD and PTSD better explains the symptoms. He would likely benefit from further evaluation of these symptoms to definitively rule in or out the aforementioned disorders. Should any of the aforementioned be ruled in, they would likely be in addition to his diagnosis of ADHD, as he described his ADHD-related concerns as occurring before some of the concerns, as consistent across situations, and independent of mood. However, it is important to note that his  indication that trauma and ADHD symptoms were occurring as far back as he can remember makes creating a diagnostic conclusion difficult. This evaluator ultimately concluded that both disorders appear to be present (versus the effects of trauma better explaining his endorsed ADHD-related symptoms), as he indicated his ADHD symptoms occur consistently across situations and independent of intrusive thoughts and hypervigilance, he endorsed ADHD-related symptoms less commonly associated with PTSD (e.g., habitual fidgeting, and regularly engaging in excessive talking and interrupting of others), and indicated that ADHD-related symptoms have largely persisted despite use of psychotropic medication Jerrilyn  et al., 2004 & Primus GUTTING [place], 2009). Moreover, he endorsed ADHD-related symptoms that are less commonly associated with autism spectrum disorder (e.g., being easily distracted by various stimuli [versus primarily internal], chronic disinhibition, and regularly engaging in excessive talking and interrupting others).   DSM-5 Diagnostic Impressions: F90.2 Attention-Deficit/Hyperactivity Disorder, Combined Presentation, Moderate F43.10 Posttraumatic Stress Disorder  RECOMMENDATIONS: Mr. Yanik would likely benefit from speaking with his medical care team about his endorsed sleep issues.  Mr. Keady may benefit from completing an autism spectrum disorder evaluation to definitively rule in or out the disorder. Mr. Gill would likely benefit from a consultation regarding medication for ADHD symptoms.   Individual therapeutic services may assist in processing a diagnosis of ADHD and discussing coping and compensatory strategies. Mr. Zagal would likely benefit from making use of strategies for ADHD symptoms:  Setting a timer to complete tasks. Break tasks into manageable chunks and spread them out over more extended periods with breaks.  Utilizing lists and day calendars to keep track of tasks.  Answering emails  daily.  Improve listening skills by asking the speaker to give information in smaller chunks and asking for explanations and clarification as needed. Leaving more than the anticipated time to complete tasks. It may help to keep tasks brief, well within your attention span, and a mix of both high and low-interest tasks. Tasks may be gradually increased in length. Practice proactive planning by setting aside time every evening to plan for the next day (e.g., prepare needed materials or pack the car the night before).  Learn how to make a practical and reasonable to-do list of important tasks and priorities and always keep it easily accessible. Make additional copies in case it is lost or misplaced. Utilize visual reminders by posting appointments, to-do lists, or schedules in strategic areas at home and work.  Practice using an appointment book, smartphone, or other tech device, or a daily planning calendar, and learn to write down appointments and commitments immediately. Keep notepads or use a portable audio recorder to capture important ideas that would be beneficial to recall later. Learn and practice time management skills. Purchase a programmable alarm watch or set an alarm on a smartphone to avoid losing track of time.  Use a color-coded file system, desk and closet organizers, storage boxes, or other organization devices to reduce clutter and improve efficiency and structure.  Implement ways to become more aware of your actions and to inhibit or adjust them as warranted (e.g., reviewing videos of your actions, considering consequences of obeying or not obeying the rules of various upcoming situations, having a trusted other to discuss plans with, and/or provide cues to stop certain behaviors, and make visual cues for rules you would like to follow). The 4Rs: Read just one paragraph, recite out loud in a soft voice or whisper what was important in that material, write that material down in a  notebook, then review what you just wrote. Stay flexible and be prepared to change your plans, as symptom breakthroughs and crises will likely occur periodically. Mr. Bonura may benefit from mindfulness training to address symptoms of inattention.  Mental alertness/energy can be raised by increasing exercise; improving sleep; eating a healthy diet; and managing stress. Consulting with a physician regarding any changes to the physical regimen is recommended. Failing at Normal: An ADHD Success Story by Harlene Solar is a great overview of ADHD. Dr. Nelwyn Pica also has a YouTube channel called, Nelwyn Pica, PhD - Dedicated to ADHD Science+ with helpful  videos on ADHD-related topics: [email or web address]  Applications:  RescueTime. Tracks your activities on your phone and/or computer to determine how productive you have been and what distracted you. Free two-week trial.  [email or web address] . It uses engineered audio that may reduce distractions and assist with focus. Free 15-day trial. Freedom. Allows you to highlight days and times you want to block yourself from certain sites or apps. Free trial. Merrily.  It allows you to input your bank accounts and creates a visual layout of information about your financial goals, budget management, alerts, etc. May offer a free trial. Boomerang. It allows you to schedule times an email is sent and to see if others have received or opened your email. Ten messages free per month and a free trial of the premium version. IFTTT. Uses channels to create various actions (e.g., if you are mentioned in an email, highlight it in your inbox, and if you miss a call, add it to a to-do list). Free and premium versions. Unroll.me. Cleans up your email by unsubscribing from what you do not want to receive while still getting everything you do. Free. Finish. It allows you to divide two-list tasks into short-term, mid-term, and long-term tasks and  determine how much time is left for a task. Focus mode hides non-priority tasks.  Autosilent. Turns your phone ringer on and off based on specified calendars, geo-fences, timers, etc. $3.99. Freakyalarm. It makes you solve math problems to disable an alarm. $1.99. Wake N Shake. It makes you vigorously shake your phone to stop the alarm. $.99. Todoist. It allows you to add sub-tasks to tasks and includes email and web plugins to make it work across the system. Premium has location-based reminders, calendar sync, productive tracking, etc.  Sleep Cycle. Utilize your phone's motion sensors to catch movement while you are asleep. The alarm will wake you as early as 30 minutes before your alarm based on your lightest sleep phase and show you how daily activities affect your sleep quality.  Colletta. Gamifies mental wellness by letting you choose from a wide variety of self-care exercises to complete, which it rewards with the ability to care for a virtual pet. It also includes features such as mood tracking, breathing exercises, guided journaling, and setting mutual goals with friends. Books: Taking Charge of Adult ADHD Second Edition by Dr. Nelwyn Pica The ADHD Effect on Marriage by Eleanor Bowers The Couples Guide to Thriving with ADHD by Eleanor Bowers The ADHD Guide to Career Success by Dr. Rollo Fess  Organizations that are a reliable source of information on ADHD:  Children and Adults with Attention-Deficit/Hyperactivity Disorder (CHADD): chadd.org  Attention Deficit Disorder Association (ADDA): hotternames.de ADD Resources: addresources.org ADD WareHouse: addwarehouse.com World Federation of ADHD: adhd-federation.org ADDConsults: addconsults.com Compilation of ADHD resources: https://www.harrell.com/ Future evaluation, if deemed necessary, and/or to determine the effectiveness of recommended interventions.   Frederic Fire, Psy.D. Licensed Psychologist -  HSP-P [phone]   References  Daun FREDRIK RONAL Lilly EMERSON Leopoldo HILARIO JAYSON., Rotrosen, J., & Laverna CANDIE MATSU. (2004). Attention- deficit/hyperactivity disorder in adult patients with posttraumatic stress disorder (PTSD): is  ADHD a vulnerability factor?. Journal of attention disorders, 8(1), 11-16.  http://www.cook.info/ Barkley, R. A. (2021). Taking charge of adult ADHD: proven strategies to succeed at work, at   home, and in relationships (pp. 6-10 and 272-276). [employer].  Primus DOROTHA BIRCH., & Signe, D. F. (2009). ADHD and posttraumatic stress disorder. Current Attention  Disorders Reports, 1(2), 60-66.  Frederic ONEIDA Fire, PsyD

## 2024-02-28 ENCOUNTER — Telehealth: Payer: Self-pay | Admitting: Psychiatry

## 2024-02-28 NOTE — Telephone Encounter (Signed)
 Jiovanni dropped off a copy of his psychological evaluation from [facility]. I have put it in your box.

## 2024-03-06 ENCOUNTER — Telehealth: Admitting: Psychiatry
